# Patient Record
Sex: Female | Born: 1990 | Race: Black or African American | Hispanic: No | Marital: Single | Smoking: Never smoker
Health system: Southern US, Community
[De-identification: ages and names within clinical notes are randomized; demographics above are authoritative.]

## PROBLEM LIST (undated history)

## (undated) ENCOUNTER — Inpatient Hospital Stay (HOSPITAL_COMMUNITY): Payer: Medicaid Other

## (undated) ENCOUNTER — Inpatient Hospital Stay (HOSPITAL_COMMUNITY): Payer: Self-pay

## (undated) DIAGNOSIS — Z8619 Personal history of other infectious and parasitic diseases: Secondary | ICD-10-CM

## (undated) DIAGNOSIS — Z789 Other specified health status: Secondary | ICD-10-CM

## (undated) HISTORY — PX: NO PAST SURGERIES: SHX2092

## (undated) HISTORY — DX: Personal history of other infectious and parasitic diseases: Z86.19

---

## 2009-11-19 ENCOUNTER — Inpatient Hospital Stay (HOSPITAL_COMMUNITY): Admission: AD | Admit: 2009-11-19 | Discharge: 2009-11-19 | Payer: Self-pay | Admitting: Obstetrics & Gynecology

## 2010-06-18 NOTE — L&D Delivery Note (Signed)
Delivery Note At 1:26 AM a viable female was delivered via Vaginal, Spontaneous Delivery (Presentation: Middle Occiput Anterior).  APGAR: 9, 9; weight .   Placenta status: Intact, Spontaneous.  Cord: 3 vessels with the following complications: None.  Cord pH: not done  Anesthesia: Epidural  Episiotomy: None Lacerations: 1st degree;Sulcus Suture Repair: 2.0 Est. Blood Loss (mL):   Mom to postpartum.  Baby to nursery-stable.  Stacey Shepard A 05/14/2011, 1:39 AM

## 2010-09-04 LAB — CBC
HCT: 38.6 % (ref 36.0–46.0)
Hemoglobin: 13.2 g/dL (ref 12.0–15.0)
MCV: 98.8 fL (ref 78.0–100.0)
Platelets: 261 10*3/uL (ref 150–400)
WBC: 7.9 10*3/uL (ref 4.0–10.5)

## 2010-09-04 LAB — URINALYSIS, ROUTINE W REFLEX MICROSCOPIC
Glucose, UA: NEGATIVE mg/dL
Nitrite: NEGATIVE
Protein, ur: NEGATIVE mg/dL
Urobilinogen, UA: 4 mg/dL — ABNORMAL HIGH (ref 0.0–1.0)

## 2010-09-04 LAB — WET PREP, GENITAL: Trich, Wet Prep: NONE SEEN

## 2010-09-26 ENCOUNTER — Inpatient Hospital Stay (INDEPENDENT_AMBULATORY_CARE_PROVIDER_SITE_OTHER)
Admission: RE | Admit: 2010-09-26 | Discharge: 2010-09-26 | Disposition: A | Discharge status: Home / Self Care | Payer: Self-pay | Source: Ambulatory Visit | Attending: Family Medicine | Admitting: Family Medicine

## 2010-09-26 DIAGNOSIS — Z331 Pregnant state, incidental: Secondary | ICD-10-CM

## 2010-09-26 LAB — POCT URINALYSIS DIP (DEVICE)
Glucose, UA: 100 mg/dL — AB
Nitrite: NEGATIVE
Protein, ur: 100 mg/dL — AB
Urobilinogen, UA: 4 mg/dL — ABNORMAL HIGH (ref 0.0–1.0)

## 2010-09-26 LAB — POCT PREGNANCY, URINE: Preg Test, Ur: POSITIVE

## 2010-11-21 ENCOUNTER — Inpatient Hospital Stay (HOSPITAL_COMMUNITY): Payer: Medicaid Other

## 2010-11-21 ENCOUNTER — Inpatient Hospital Stay (HOSPITAL_COMMUNITY)
Admission: AD | Admit: 2010-11-21 | Discharge: 2010-11-21 | Disposition: A | Discharge status: Home / Self Care | Payer: Medicaid Other | Source: Ambulatory Visit | Attending: Obstetrics | Admitting: Obstetrics

## 2010-11-21 DIAGNOSIS — R109 Unspecified abdominal pain: Secondary | ICD-10-CM | POA: Insufficient documentation

## 2010-11-21 DIAGNOSIS — O210 Mild hyperemesis gravidarum: Secondary | ICD-10-CM | POA: Insufficient documentation

## 2010-11-21 LAB — URINALYSIS, ROUTINE W REFLEX MICROSCOPIC
Bilirubin Urine: NEGATIVE
Ketones, ur: NEGATIVE mg/dL
Nitrite: NEGATIVE
Protein, ur: NEGATIVE mg/dL

## 2010-11-21 LAB — CBC
Hemoglobin: 11.6 g/dL — ABNORMAL LOW (ref 12.0–15.0)
MCHC: 34.9 g/dL (ref 30.0–36.0)
Platelets: 265 10*3/uL (ref 150–400)
RBC: 3.57 MIL/uL — ABNORMAL LOW (ref 3.87–5.11)

## 2010-11-21 LAB — WET PREP, GENITAL: Trich, Wet Prep: NONE SEEN

## 2010-11-21 LAB — COMPREHENSIVE METABOLIC PANEL
ALT: 14 U/L (ref 0–35)
AST: 18 U/L (ref 0–37)
Albumin: 2.9 g/dL — ABNORMAL LOW (ref 3.5–5.2)
CO2: 22 mEq/L (ref 19–32)
Calcium: 8.8 mg/dL (ref 8.4–10.5)
Chloride: 96 mEq/L (ref 96–112)
Creatinine, Ser: <0.47 mg/dL (ref 0.4–1.2)
Sodium: 126 mEq/L — ABNORMAL LOW (ref 135–145)

## 2010-11-22 LAB — URINE CULTURE
Colony Count: 7000
Culture  Setup Time: 201206060206

## 2011-04-06 LAB — ANTIBODY SCREEN: Antibody Screen: NEGATIVE

## 2011-05-13 ENCOUNTER — Inpatient Hospital Stay (HOSPITAL_COMMUNITY): Payer: Medicaid Other | Admitting: Anesthesiology

## 2011-05-13 ENCOUNTER — Inpatient Hospital Stay (HOSPITAL_COMMUNITY)
Admission: RE | Admit: 2011-05-13 | Discharge: 2011-05-16 | DRG: 775 | Disposition: A | Discharge status: Home / Self Care | Payer: Medicaid Other | Source: Ambulatory Visit | Attending: Obstetrics | Admitting: Obstetrics

## 2011-05-13 ENCOUNTER — Encounter (HOSPITAL_COMMUNITY): Payer: Self-pay

## 2011-05-13 ENCOUNTER — Encounter (HOSPITAL_COMMUNITY): Payer: Self-pay | Admitting: Anesthesiology

## 2011-05-13 LAB — CBC
HCT: 36.3 % (ref 36.0–46.0)
Hemoglobin: 12.4 g/dL (ref 12.0–15.0)
RBC: 3.94 MIL/uL (ref 3.87–5.11)
WBC: 7 10*3/uL (ref 4.0–10.5)

## 2011-05-13 LAB — RPR: RPR Ser Ql: NONREACTIVE

## 2011-05-13 MED ORDER — PHENYLEPHRINE 40 MCG/ML (10ML) SYRINGE FOR IV PUSH (FOR BLOOD PRESSURE SUPPORT): 80.0000 ug | PREFILLED_SYRINGE | INTRAVENOUS | Status: DC | PRN

## 2011-05-13 MED ORDER — EPHEDRINE 5 MG/ML INJ: 10.0000 mg | INTRAVENOUS | Status: DC | PRN

## 2011-05-13 MED ORDER — ONDANSETRON HCL 4 MG/2ML IJ SOLN
4.0000 mg | Freq: Four times a day (QID) | INTRAMUSCULAR | Status: DC | PRN
  Administered 2011-05-13: 4 mg via INTRAVENOUS
  Filled 2011-05-13: qty 2

## 2011-05-13 MED ORDER — LACTATED RINGERS IV SOLN
500.0000 mL | INTRAVENOUS | Status: DC | PRN
Start: 2011-05-13 — End: 2011-05-14

## 2011-05-13 MED ORDER — OXYCODONE-ACETAMINOPHEN 5-325 MG PO TABS
2.0000 | ORAL_TABLET | ORAL | Status: DC | PRN
  Filled 2011-05-13: qty 1

## 2011-05-13 MED ORDER — LIDOCAINE HCL (PF) 1 % IJ SOLN
30.0000 mL | INTRAMUSCULAR | Status: DC | PRN
  Filled 2011-05-13: qty 30

## 2011-05-13 MED ORDER — FLEET ENEMA 7-19 GM/118ML RE ENEM: 1.0000 | ENEMA | RECTAL | Status: DC | PRN

## 2011-05-13 MED ORDER — OXYTOCIN BOLUS FROM INFUSION
500.0000 mL | Freq: Once | INTRAVENOUS | Status: DC
  Filled 2011-05-13: qty 500
  Filled 2011-05-13: qty 1000

## 2011-05-13 MED ORDER — CITRIC ACID-SODIUM CITRATE 334-500 MG/5ML PO SOLN: 30.0000 mL | ORAL | Status: DC | PRN

## 2011-05-13 MED ORDER — LACTATED RINGERS IV SOLN
INTRAVENOUS | Status: DC
  Administered 2011-05-13 (×4): via INTRAVENOUS

## 2011-05-13 MED ORDER — LACTATED RINGERS IV SOLN: 500.0000 mL | Freq: Once | INTRAVENOUS | Status: DC

## 2011-05-13 MED ORDER — OXYTOCIN 20 UNITS IN LACTATED RINGERS INFUSION - SIMPLE
1.0000 m[IU]/min | INTRAVENOUS | Status: DC
  Administered 2011-05-13: 1 m[IU]/min via INTRAVENOUS

## 2011-05-13 MED ORDER — IBUPROFEN 600 MG PO TABS
600.0000 mg | ORAL_TABLET | Freq: Four times a day (QID) | ORAL | Status: DC | PRN
  Filled 2011-05-13 (×2): qty 1

## 2011-05-13 MED ORDER — TERBUTALINE SULFATE 1 MG/ML IJ SOLN: 0.2500 mg | Freq: Once | INTRAMUSCULAR | Status: AC | PRN

## 2011-05-13 MED ORDER — OXYTOCIN 20 UNITS IN LACTATED RINGERS INFUSION - SIMPLE: 125.0000 mL/h | Freq: Once | INTRAVENOUS | Status: DC

## 2011-05-13 MED ORDER — EPHEDRINE 5 MG/ML INJ
10.0000 mg | INTRAVENOUS | Status: DC | PRN
  Administered 2011-05-13: 20 mg via INTRAVENOUS
  Filled 2011-05-13: qty 4

## 2011-05-13 MED ORDER — LIDOCAINE-EPINEPHRINE (PF) 2 %-1:200000 IJ SOLN
INTRAMUSCULAR | Status: DC | PRN
  Administered 2011-05-13: 4 mL via EPIDURAL

## 2011-05-13 MED ORDER — ACETAMINOPHEN 325 MG PO TABS: 650.0000 mg | ORAL_TABLET | ORAL | Status: DC | PRN

## 2011-05-13 MED ORDER — BUTORPHANOL TARTRATE 2 MG/ML IJ SOLN
1.0000 mg | INTRAMUSCULAR | Status: DC | PRN
  Administered 2011-05-13 (×2): 1 mg via INTRAVENOUS
  Filled 2011-05-13 (×2): qty 1

## 2011-05-13 MED ORDER — PHENYLEPHRINE 40 MCG/ML (10ML) SYRINGE FOR IV PUSH (FOR BLOOD PRESSURE SUPPORT)
80.0000 ug | PREFILLED_SYRINGE | INTRAVENOUS | Status: DC | PRN
  Filled 2011-05-13: qty 5

## 2011-05-13 MED ORDER — DIPHENHYDRAMINE HCL 50 MG/ML IJ SOLN: 12.5000 mg | INTRAMUSCULAR | Status: DC | PRN

## 2011-05-13 MED ORDER — FENTANYL 2.5 MCG/ML BUPIVACAINE 1/10 % EPIDURAL INFUSION (WH - ANES)
INTRAMUSCULAR | Status: DC | PRN
  Administered 2011-05-13: 14 mL/h via EPIDURAL

## 2011-05-13 MED ORDER — FENTANYL 2.5 MCG/ML BUPIVACAINE 1/10 % EPIDURAL INFUSION (WH - ANES)
14.0000 mL/h | INTRAMUSCULAR | Status: DC
  Filled 2011-05-13: qty 60

## 2011-05-13 NOTE — Progress Notes (Signed)
[place] intermittently tracing maternal heart rate--pt at bedside on birthing ball

## 2011-05-13 NOTE — Anesthesia Procedure Notes (Signed)

## 2011-05-13 NOTE — Anesthesia Preprocedure Evaluation (Signed)

## 2011-05-13 NOTE — H&P (Signed)
This is Dr. Francoise Ceo dictating the history and physical on  Stacey Shepard she's a 20 year old gravida 1 at 55 weeks and 3 days used to 05/03/2011 negative GBS was brought in for induction cervix no 2-3 cm 90% vertex -2 amniotomy was performed the fluid was clear and she still contracting irregularly on low-dose Pitocin Past medical history negative Past surgical history negative System review negative Social history negative Physical exam well-developed female in no distress HEENT negative Breasts negative Heart regular rhythm no murmurs no gallops Abdomen term Pelvic as described above Extremities negative

## 2011-05-14 ENCOUNTER — Encounter (HOSPITAL_COMMUNITY): Payer: Self-pay

## 2011-05-14 LAB — CBC
HCT: 31.6 % — ABNORMAL LOW (ref 36.0–46.0)
Hemoglobin: 10.5 g/dL — ABNORMAL LOW (ref 12.0–15.0)
MCH: 30.9 pg (ref 26.0–34.0)
MCHC: 33.2 g/dL (ref 30.0–36.0)
MCV: 92.9 fL (ref 78.0–100.0)
RBC: 3.4 MIL/uL — ABNORMAL LOW (ref 3.87–5.11)

## 2011-05-14 MED ORDER — LANOLIN HYDROUS EX OINT: TOPICAL_OINTMENT | CUTANEOUS | Status: DC | PRN

## 2011-05-14 MED ORDER — INFLUENZA VIRUS VACC SPLIT PF IM SUSP
0.5000 mL | INTRAMUSCULAR | Status: AC
  Filled 2011-05-14: qty 0.5

## 2011-05-14 MED ORDER — SIMETHICONE 80 MG PO CHEW: 80.0000 mg | CHEWABLE_TABLET | ORAL | Status: DC | PRN

## 2011-05-14 MED ORDER — FERROUS SULFATE 325 (65 FE) MG PO TABS
325.0000 mg | ORAL_TABLET | Freq: Two times a day (BID) | ORAL | Status: DC
  Administered 2011-05-14 – 2011-05-16 (×5): 325 mg via ORAL
  Filled 2011-05-14 (×5): qty 1

## 2011-05-14 MED ORDER — IBUPROFEN 600 MG PO TABS
600.0000 mg | ORAL_TABLET | Freq: Four times a day (QID) | ORAL | Status: DC
  Administered 2011-05-14 – 2011-05-16 (×9): 600 mg via ORAL
  Filled 2011-05-14 (×7): qty 1

## 2011-05-14 MED ORDER — ZOLPIDEM TARTRATE 5 MG PO TABS: 5.0000 mg | ORAL_TABLET | Freq: Every evening | ORAL | Status: DC | PRN

## 2011-05-14 MED ORDER — BENZOCAINE-MENTHOL 20-0.5 % EX AERO: 1.0000 "application " | INHALATION_SPRAY | CUTANEOUS | Status: DC | PRN

## 2011-05-14 MED ORDER — OXYCODONE-ACETAMINOPHEN 5-325 MG PO TABS
1.0000 | ORAL_TABLET | ORAL | Status: DC | PRN
  Administered 2011-05-14 – 2011-05-15 (×2): 1 via ORAL
  Administered 2011-05-15: 2 via ORAL
  Administered 2011-05-16: 1 via ORAL
  Filled 2011-05-14: qty 1
  Filled 2011-05-14: qty 2
  Filled 2011-05-14: qty 1

## 2011-05-14 MED ORDER — ONDANSETRON HCL 4 MG/2ML IJ SOLN
4.0000 mg | INTRAMUSCULAR | Status: DC | PRN
  Administered 2011-05-14: 4 mg via INTRAVENOUS
  Filled 2011-05-14: qty 2

## 2011-05-14 MED ORDER — DIBUCAINE 1 % RE OINT: 1.0000 "application " | TOPICAL_OINTMENT | RECTAL | Status: DC | PRN

## 2011-05-14 MED ORDER — DIPHENHYDRAMINE HCL 25 MG PO CAPS: 25.0000 mg | ORAL_CAPSULE | Freq: Four times a day (QID) | ORAL | Status: DC | PRN

## 2011-05-14 MED ORDER — TETANUS-DIPHTH-ACELL PERTUSSIS 5-2.5-18.5 LF-MCG/0.5 IM SUSP
0.5000 mL | Freq: Once | INTRAMUSCULAR | Status: AC
  Administered 2011-05-15: 0.5 mL via INTRAMUSCULAR
  Filled 2011-05-14: qty 0.5

## 2011-05-14 MED ORDER — WITCH HAZEL-GLYCERIN EX PADS: 1.0000 "application " | MEDICATED_PAD | CUTANEOUS | Status: DC | PRN

## 2011-05-14 MED ORDER — PRENATAL PLUS 27-1 MG PO TABS
1.0000 | ORAL_TABLET | Freq: Every day | ORAL | Status: DC
  Administered 2011-05-14 – 2011-05-16 (×3): 1 via ORAL
  Filled 2011-05-14 (×3): qty 1

## 2011-05-14 MED ORDER — ONDANSETRON HCL 4 MG PO TABS: 4.0000 mg | ORAL_TABLET | ORAL | Status: DC | PRN

## 2011-05-14 MED ORDER — SENNOSIDES-DOCUSATE SODIUM 8.6-50 MG PO TABS
2.0000 | ORAL_TABLET | Freq: Every day | ORAL | Status: DC
  Administered 2011-05-14 – 2011-05-15 (×2): 2 via ORAL

## 2011-05-14 NOTE — Progress Notes (Signed)
UR chart review completed.  

## 2011-05-14 NOTE — Progress Notes (Addendum)
Charted 5am assessment under wrong column, assessment done at 0500, not 0537

## 2011-05-15 MED ORDER — BENZOCAINE-MENTHOL 20-0.5 % EX AERO
INHALATION_SPRAY | CUTANEOUS | Status: AC
  Administered 2011-05-15: 22:00:00
  Filled 2011-05-15: qty 56

## 2011-05-15 NOTE — Progress Notes (Signed)
Patient ID: Stacey Shepard, female   DOB: 11/26/1990, 20 y.o.   MRN: [id number] Postpartum day #1 Fundus firm  Lochia moderate Legs negative No problems

## 2011-05-16 MED ORDER — OXYCODONE-ACETAMINOPHEN 5-325 MG PO TABS: 1.0000 | ORAL_TABLET | ORAL | Status: AC | PRN

## 2011-05-16 NOTE — Discharge Summary (Signed)
Obstetric Discharge Summary Reason for Admission: induction of labor Prenatal Procedures: none Intrapartum Procedures: spontaneous vaginal delivery Postpartum Procedures: none Complications-Operative and Postpartum: none Hemoglobin  Date Value Range Status  05/14/2011 10.5* 12.0-15.0 (g/dL) Final     HCT  Date Value Range Status  05/14/2011 31.6* 36.0-46.0 (%) Final    Discharge Diagnoses: Term Pregnancy-delivered  Discharge Information: Date: 05/16/2011 Activity: pelvic rest Diet: routine Medications: Percocet Condition: stable Instructions: refer to practice specific booklet Discharge to: home Follow-up Information    Follow up with Nolton Denis A, MD. Call in 6 weeks.   Contact information:   [address] [place] [address] [phone]          Newborn Data: Live born female  Birth Weight: 6 lb 12.1 oz (3065 g) APGAR: 9, 9  Home with mother.  Stacey Shepard 05/16/2011, 7:06 AM

## 2011-05-16 NOTE — Progress Notes (Signed)
Patient ID: Stacey Shepard, female   DOB: 06/20/1990, 20 y.o.   MRN: [id number] Postpartum day 2 Fundus firm Bile signs normal Lochia moderate Legs negative Discharge today

## 2011-05-19 ENCOUNTER — Inpatient Hospital Stay (HOSPITAL_COMMUNITY): Admit: 2011-05-19 | Payer: Self-pay | Source: Ambulatory Visit | Admitting: Obstetrics

## 2011-06-19 NOTE — L&D Delivery Note (Signed)
Delivery Note At 6:50 AM a viable female was delivered via  (Presentation: ;  ).  APGAR: , ; weight .   Placenta status: Intact, Spontaneous.  Cord: 3 vessels with the following complications: None.  Cord pH: not done  Anesthesia: Epidural  Episiotomy:  Lacerations:  Suture Repair: 2.0 Est. Blood Loss (mL):   Mom to postpartum.  Baby to nursery-stable.  MARSHALL,BERNARD A 05/26/2012, 6:57 AM

## 2011-11-29 ENCOUNTER — Encounter (HOSPITAL_COMMUNITY): Payer: Self-pay | Admitting: *Deleted

## 2011-11-29 ENCOUNTER — Inpatient Hospital Stay (HOSPITAL_COMMUNITY)
Admission: AD | Admit: 2011-11-29 | Discharge: 2011-11-29 | Disposition: A | Discharge status: Home / Self Care | Payer: Medicaid Other | Source: Ambulatory Visit | Attending: Obstetrics & Gynecology | Admitting: Obstetrics & Gynecology

## 2011-11-29 ENCOUNTER — Emergency Department (INDEPENDENT_AMBULATORY_CARE_PROVIDER_SITE_OTHER)
Admission: EM | Admit: 2011-11-29 | Discharge: 2011-11-29 | Disposition: A | Discharge status: Other Acute Inpatient Hospital | Payer: Self-pay | Source: Home / Self Care | Attending: Emergency Medicine | Admitting: Emergency Medicine

## 2011-11-29 DIAGNOSIS — A5901 Trichomonal vulvovaginitis: Secondary | ICD-10-CM | POA: Insufficient documentation

## 2011-11-29 DIAGNOSIS — R109 Unspecified abdominal pain: Secondary | ICD-10-CM

## 2011-11-29 DIAGNOSIS — N949 Unspecified condition associated with female genital organs and menstrual cycle: Secondary | ICD-10-CM

## 2011-11-29 DIAGNOSIS — Z331 Pregnant state, incidental: Secondary | ICD-10-CM

## 2011-11-29 DIAGNOSIS — O98819 Other maternal infectious and parasitic diseases complicating pregnancy, unspecified trimester: Secondary | ICD-10-CM | POA: Insufficient documentation

## 2011-11-29 DIAGNOSIS — Z3490 Encounter for supervision of normal pregnancy, unspecified, unspecified trimester: Secondary | ICD-10-CM

## 2011-11-29 DIAGNOSIS — R102 Pelvic and perineal pain: Secondary | ICD-10-CM

## 2011-11-29 LAB — POCT URINALYSIS DIP (DEVICE)
Bilirubin Urine: NEGATIVE
Glucose, UA: NEGATIVE mg/dL
Hgb urine dipstick: NEGATIVE
Ketones, ur: NEGATIVE mg/dL
Nitrite: NEGATIVE

## 2011-11-29 LAB — WET PREP, GENITAL

## 2011-11-29 MED ORDER — PRENATAL PLUS 27-1 MG PO TABS: 1.0000 | ORAL_TABLET | Freq: Every day | ORAL | Status: DC

## 2011-11-29 MED ORDER — METRONIDAZOLE 500 MG PO TABS: 500.0000 mg | ORAL_TABLET | Freq: Two times a day (BID) | ORAL | Status: AC

## 2011-11-29 MED ORDER — METRONIDAZOLE 500 MG PO TABS: 500.0000 mg | ORAL_TABLET | Freq: Once | ORAL | Status: DC

## 2011-11-29 NOTE — MAU Provider Note (Signed)
Stacey L Johnson20 y.o.G2P1001 @[redacted]w[redacted]d  by LMP 08/14/11 Chief Complaint  Patient presents with  . Pelvic Pain    Pt contact at 1630    SUBJECTIVE  HPI: She is sent from [facility] where she presented with lower abdominal pain today. Had positive UPT and negative UA. She describes mild right lower quadrant and bilateral groin pain worse with walking. No vaginal bleeding other than spotting in early March. She has a 56-month-old.   Past Medical History  Diagnosis Date  . No pertinent past medical history    Past Surgical History  Procedure Date  . No past surgeries    History   Social History  . Marital Status: Single    Spouse Name: N/A    Number of Children: N/A  . Years of Education: N/A   Occupational History  . Not on file.   Social History Main Topics  . Smoking status: Never Smoker   . Smokeless tobacco: Not on file  . Alcohol Use: No  . Drug Use: No  . Sexually Active: Yes   Other Topics Concern  . Not on file   Social History Narrative  . No narrative on file   No current facility-administered medications on file prior to encounter.   No current outpatient prescriptions on file prior to encounter.   No Known Allergies  ROS: Pertinent items in HPI  OBJECTIVE Blood pressure 100/44, pulse 95, temperature 98 F (36.7 C), temperature source Oral, resp. rate 18, last menstrual period 08/14/2011, not currently breastfeeding.  GENERAL: Well-developed, well-nourished female in no acute distress.  HEENT: Normocephalic, good dentition HEART: normal rate RESP: normal effort ABDOMEN: Soft, minimally tender right lower quadrant to midline, no rebound, no guarding, DT FHR 152 EXTREMITIES: Nontender, no edema NEURO: Alert and oriented SPECULUM EXAM: NEFG, frothy discharge, no blood noted, cervix clean BIMANUAL: cervix closed; uterus appropriate size for gestational age; no adnexal tenderness or masses   LAB RESULTS Results for orders placed during the  hospital encounter of 11/29/11 (from the past 24 hour(s))  WET PREP, GENITAL     Status: Abnormal   Collection Time   11/29/11  4:38 PM      Component Value Range   Yeast Wet Prep HPF POC NONE SEEN  NONE SEEN   Trich, Wet Prep FEW (*) NONE SEEN   Clue Cells Wet Prep HPF POC FEW (*) NONE SEEN   WBC, Wet Prep HPF POC MODERATE (*) NONE SEEN        ASSESSMENT [id number]@ [redacted]w[redacted]d RLP Trichimonas vaginalis Short interval b/t pregnancies  PLAN Rx Flagyl GC/CT sent RLP relief measures Pregnancy verification letter, prescription for prenatal vitamins, pregnancy precautions PNC with Dr. Gaynell Face asap     Kahlen Boyde 11/29/2011 4:43 PM

## 2011-11-29 NOTE — ED Notes (Signed)
Pt  Reports  Symptoms  Of  abd  Pain   With  Nausea  Vomiting  And  Diarrhea        She  Is  Also  Late  And  irreg  With  Periods       Sexually  Active  No  Birth  Control         She    Is  Ambulatory  With a  Steady   Fluid  Gait

## 2011-11-29 NOTE — ED Notes (Signed)
Pt  Will  Be  Discharged   And  [facility]  [facility]

## 2011-11-29 NOTE — Discharge Instructions (Signed)
Trichomoniasis  Trichomoniasis is an infection, caused by the Trichomonas organism, that affects both women and men. In women, the outer female genitalia and the vagina are affected. In men, the penis is mainly affected, but the prostate and other reproductive organs can also be involved. Trichomoniasis is a sexually transmitted disease (STD) and is most often passed to another person through sexual contact. The majority of people who get trichomoniasis do so from a sexual encounter and are also at risk for other STDs.  CAUSES    Sexual intercourse with an infected partner.   It can be present in swimming pools or hot tubs.  SYMPTOMS    Abnormal gray-green frothy vaginal discharge in women.   Vaginal itching and irritation in women.   Itching and irritation of the area outside the vagina in women.   Penile discharge with or without pain in males.   Inflammation of the urethra (urethritis), causing painful urination.   Bleeding after sexual intercourse.  RELATED COMPLICATIONS   Pelvic inflammatory disease.   Infection of the uterus (endometritis).   Infertility.   Tubal (ectopic) pregnancy.   It can be associated with other STDs, including gonorrhea and chlamydia, hepatitis B, and HIV.  COMPLICATIONS DURING PREGNANCY   Early (premature) delivery.   Premature rupture of the membranes (PROM).   Low birth weight.  DIAGNOSIS    Visualization of Trichomonas under the microscope from the vagina discharge.   Ph of the vagina greater than 4.5, tested with a test tape.   Trich Rapid Test.   Culture of the organism, but this is not usually needed.   It may be found on a Pap test.   Having a "strawberry cervix,"which means the cervix looks very red like a strawberry.  TREATMENT    You may be given medication to fight the infection. Inform your caregiver if you could be or are pregnant. Some medications used to treat the infection should not be taken during pregnancy.   Over-the-counter medications or  creams to decrease itching or irritation may be recommended.   Your sexual partner will need to be treated if infected.  HOME CARE INSTRUCTIONS    Take all medication prescribed by your caregiver.   Take over-the-counter medication for itching or irritation as directed by your caregiver.   Do not have sexual intercourse while you have the infection.   Do not douche or wear tampons.   Discuss your infection with your partner, as your partner may have acquired the infection from you. Or, your partner may have been the person who transmitted the infection to you.   Have your sex partner examined and treated if necessary.   Practice safe, informed, and protected sex.   See your caregiver for other STD testing.  SEEK MEDICAL CARE IF:    You still have symptoms after you finish the medication.   You have an oral temperature above 102 F (38.9 C).   You develop belly (abdominal) pain.   You have pain when you urinate.   You have bleeding after sexual intercourse.   You develop a rash.   The medication makes you sick or makes you throw up (vomit).  Document Released: 11/28/2000 Document Revised: 05/24/2011 Document Reviewed: 12/24/2008  ExitCare Patient Information 2012 [employer].

## 2011-11-29 NOTE — ED Provider Notes (Signed)
History     CSN: [id number]  Arrival date & time 11/29/11  1226   First MD Initiated Contact with Patient 11/29/11 1326      Chief Complaint  Patient presents with  . Abdominal Pain    (Consider location/radiation/quality/duration/timing/severity/associated sxs/prior treatment) HPI Comments: Patient presents urgent care complaining of lower abdominal pain (points to right lower quadrant and suprapubic area) it's been bothering her for about 5-6 days, worse since yesterday. She feels somewhat nauseous and he vomited and describes a episodes of loose stools. She describes her last menstrual period she thinks was in February with some irregular spotting noted at the end of March. Patient has a 32-month-old girl, and have not been using any type of birth control and is sexually active. She describes the pain as mainly elicited with walking and palpation.  She denies any dysuria, no fevers, and no active vaginal bleeding or discharge.  Patient is a 21 y.o. female presenting with abdominal pain. The history is provided by the patient.  Abdominal Pain The primary symptoms of the illness include abdominal pain, nausea and vomiting. The primary symptoms of the illness do not include fever, fatigue, dysuria, vaginal discharge or vaginal bleeding. The current episode started more than 2 days ago. The onset of the illness was sudden.  Symptoms associated with the illness do not include chills, constipation, urgency or frequency.    Past Medical History  Diagnosis Date  . No pertinent past medical history     Past Surgical History  Procedure Date  . No past surgeries     No family history on file.  History  Substance Use Topics  . Smoking status: Never Smoker   . Smokeless tobacco: Not on file  . Alcohol Use: No    OB History    Grav Para Term Preterm Abortions TAB SAB Ect Mult Living   1 1 1  0 0 0 0 0 0 1      Review of Systems  Constitutional: Negative for fever, chills,  activity change and fatigue.  Gastrointestinal: Positive for nausea, vomiting and abdominal pain. Negative for constipation and abdominal distention.  Genitourinary: Positive for pelvic pain. Negative for dysuria, urgency, frequency, vaginal bleeding, vaginal discharge and vaginal pain.    Allergies  Review of patient's allergies indicates no known allergies.  Home Medications  No current outpatient prescriptions on file.  BP 94/56  Pulse 82  Temp 97.6 F (36.4 C) (Oral)  Resp 14  SpO2 97%  Physical Exam  Nursing note and vitals reviewed. Constitutional: Vital signs are normal. She appears well-developed and well-nourished.  Non-toxic appearance. She does not have a sickly appearance. She does not appear ill. No distress.  Abdominal: Soft. She exhibits no distension and no mass. There is tenderness. There is no rebound and no guarding.  Genitourinary:     Neurological: She is alert.  Skin: Skin is warm.    ED Course  Procedures (including critical care time)  Labs Reviewed  POCT URINALYSIS DIP (DEVICE) - Abnormal; Notable for the following:    Leukocytes, UA SMALL (*)  Biochemical Testing Only. Please order routine urinalysis from main lab if confirmatory testing is needed.   All other components within normal limits  POCT PREGNANCY, URINE - Abnormal; Notable for the following:    Preg Test, Ur POSITIVE (*)     All other components within normal limits   No results found.   1. Pregnancy   2. Pelvic pain  MDM  Pelvic pain, second trimester pregnancy. Patient was transferred to [facility] hospital by her own transportation to rule out an ectopic pregnancy. Patient is hemodynamically stable looks comfortable was unaware that she is pregnant. Have called MAU unit and discuss case with nurse practitioner Christy Gentles)        Jimmie Molly, MD 11/29/11 [phone]

## 2011-11-29 NOTE — MAU Note (Signed)
Pt states she started having pain a few days ago

## 2011-11-29 NOTE — Discharge Instructions (Signed)
[facility] inform [facility] that you are on your way as you need further evaluation in relationship with your pelvic pain and your pregnancy.

## 2011-11-29 NOTE — ED Notes (Signed)
Pt  denys  Any  Vaginal  Bleeding  /  Discharge  Or     Urinary  Symptoms

## 2011-11-29 NOTE — Progress Notes (Signed)
Heart tones heard mid pelvic area

## 2011-11-30 LAB — GC/CHLAMYDIA PROBE AMP, GENITAL
Chlamydia, DNA Probe: NEGATIVE
GC Probe Amp, Genital: NEGATIVE

## 2012-01-25 LAB — OB RESULTS CONSOLE HIV ANTIBODY (ROUTINE TESTING): HIV: NONREACTIVE

## 2012-01-25 LAB — OB RESULTS CONSOLE ABO/RH: RH Type: POSITIVE

## 2012-01-25 LAB — OB RESULTS CONSOLE RPR: RPR: NONREACTIVE

## 2012-05-22 ENCOUNTER — Encounter (HOSPITAL_COMMUNITY): Payer: Self-pay | Admitting: *Deleted

## 2012-05-22 ENCOUNTER — Telehealth (HOSPITAL_COMMUNITY): Payer: Self-pay | Admitting: *Deleted

## 2012-05-22 NOTE — Telephone Encounter (Signed)
Preadmission screen  

## 2012-05-25 ENCOUNTER — Encounter (HOSPITAL_COMMUNITY): Payer: Self-pay

## 2012-05-25 ENCOUNTER — Inpatient Hospital Stay (HOSPITAL_COMMUNITY)
Admission: RE | Admit: 2012-05-25 | Discharge: 2012-05-28 | DRG: 775 | Disposition: A | Discharge status: Home / Self Care | Payer: Medicaid Other | Source: Ambulatory Visit | Attending: Obstetrics | Admitting: Obstetrics

## 2012-05-25 LAB — CBC
HCT: 33 % — ABNORMAL LOW (ref 36.0–46.0)
MCV: 90.2 fL (ref 78.0–100.0)
RBC: 3.66 MIL/uL — ABNORMAL LOW (ref 3.87–5.11)
RDW: 13.8 % (ref 11.5–15.5)
WBC: 5.4 10*3/uL (ref 4.0–10.5)

## 2012-05-25 MED ORDER — FLEET ENEMA 7-19 GM/118ML RE ENEM: 1.0000 | ENEMA | RECTAL | Status: DC | PRN

## 2012-05-25 MED ORDER — MISOPROSTOL 25 MCG QUARTER TABLET
25.0000 ug | ORAL_TABLET | ORAL | Status: DC | PRN
  Administered 2012-05-25 – 2012-05-26 (×2): 25 ug via VAGINAL
  Filled 2012-05-25 (×2): qty 0.25

## 2012-05-25 MED ORDER — IBUPROFEN 600 MG PO TABS
600.0000 mg | ORAL_TABLET | Freq: Four times a day (QID) | ORAL | Status: DC | PRN
  Filled 2012-05-25 (×4): qty 1

## 2012-05-25 MED ORDER — BUTORPHANOL TARTRATE 1 MG/ML IJ SOLN
1.0000 mg | INTRAMUSCULAR | Status: DC | PRN
  Administered 2012-05-26: 1 mg via INTRAVENOUS
  Filled 2012-05-25: qty 1

## 2012-05-25 MED ORDER — LACTATED RINGERS IV SOLN: 500.0000 mL | INTRAVENOUS | Status: DC | PRN

## 2012-05-25 MED ORDER — ONDANSETRON HCL 4 MG/2ML IJ SOLN: 4.0000 mg | Freq: Four times a day (QID) | INTRAMUSCULAR | Status: DC | PRN

## 2012-05-25 MED ORDER — LACTATED RINGERS IV SOLN
INTRAVENOUS | Status: DC
  Administered 2012-05-25 – 2012-05-26 (×3): via INTRAVENOUS

## 2012-05-25 MED ORDER — TERBUTALINE SULFATE 1 MG/ML IJ SOLN: 0.2500 mg | Freq: Once | INTRAMUSCULAR | Status: AC | PRN

## 2012-05-25 MED ORDER — OXYTOCIN BOLUS FROM INFUSION
500.0000 mL | INTRAVENOUS | Status: DC
  Administered 2012-05-26: 500 mL via INTRAVENOUS

## 2012-05-25 MED ORDER — LIDOCAINE HCL (PF) 1 % IJ SOLN
30.0000 mL | INTRAMUSCULAR | Status: DC | PRN
  Filled 2012-05-25: qty 30

## 2012-05-25 MED ORDER — OXYCODONE-ACETAMINOPHEN 5-325 MG PO TABS
1.0000 | ORAL_TABLET | ORAL | Status: DC | PRN
  Administered 2012-05-26 – 2012-05-28 (×4): 1 via ORAL
  Filled 2012-05-25 (×2): qty 1

## 2012-05-25 MED ORDER — ACETAMINOPHEN 325 MG PO TABS: 650.0000 mg | ORAL_TABLET | ORAL | Status: DC | PRN

## 2012-05-25 MED ORDER — CITRIC ACID-SODIUM CITRATE 334-500 MG/5ML PO SOLN: 30.0000 mL | ORAL | Status: DC | PRN

## 2012-05-25 MED ORDER — OXYTOCIN 40 UNITS IN LACTATED RINGERS INFUSION - SIMPLE MED
62.5000 mL/h | INTRAVENOUS | Status: DC
  Filled 2012-05-25: qty 1000

## 2012-05-25 NOTE — Progress Notes (Signed)
Dr. Tamela Oddi called informed of Dr. Elsie Stain IOL for elective reason, updated pt is a G2P1 at 39.6 with neg GBS, given pt status and SVE, orders received for Cytotec and Stadol PRN

## 2012-05-26 ENCOUNTER — Encounter (HOSPITAL_COMMUNITY): Payer: Self-pay | Admitting: Anesthesiology

## 2012-05-26 ENCOUNTER — Encounter (HOSPITAL_COMMUNITY): Payer: Self-pay

## 2012-05-26 ENCOUNTER — Inpatient Hospital Stay (HOSPITAL_COMMUNITY): Payer: Medicaid Other | Admitting: Anesthesiology

## 2012-05-26 MED ORDER — WITCH HAZEL-GLYCERIN EX PADS: 1.0000 "application " | MEDICATED_PAD | CUTANEOUS | Status: DC | PRN

## 2012-05-26 MED ORDER — FENTANYL 2.5 MCG/ML BUPIVACAINE 1/10 % EPIDURAL INFUSION (WH - ANES)
14.0000 mL/h | INTRAMUSCULAR | Status: DC
  Filled 2012-05-26: qty 125

## 2012-05-26 MED ORDER — TETANUS-DIPHTH-ACELL PERTUSSIS 5-2.5-18.5 LF-MCG/0.5 IM SUSP
0.5000 mL | Freq: Once | INTRAMUSCULAR | Status: AC
  Administered 2012-05-27: 0.5 mL via INTRAMUSCULAR
  Filled 2012-05-26: qty 0.5

## 2012-05-26 MED ORDER — FENTANYL 2.5 MCG/ML BUPIVACAINE 1/10 % EPIDURAL INFUSION (WH - ANES)
INTRAMUSCULAR | Status: DC | PRN
  Administered 2012-05-26: 14 mL/h via EPIDURAL

## 2012-05-26 MED ORDER — BENZOCAINE-MENTHOL 20-0.5 % EX AERO
1.0000 "application " | INHALATION_SPRAY | CUTANEOUS | Status: DC | PRN
  Administered 2012-05-26: 1 via TOPICAL
  Filled 2012-05-26: qty 56

## 2012-05-26 MED ORDER — EPHEDRINE 5 MG/ML INJ
10.0000 mg | INTRAVENOUS | Status: DC | PRN
  Filled 2012-05-26: qty 4

## 2012-05-26 MED ORDER — LACTATED RINGERS IV SOLN
500.0000 mL | Freq: Once | INTRAVENOUS | Status: AC
  Administered 2012-05-26: 500 mL via INTRAVENOUS

## 2012-05-26 MED ORDER — FERROUS SULFATE 325 (65 FE) MG PO TABS
325.0000 mg | ORAL_TABLET | Freq: Two times a day (BID) | ORAL | Status: DC
  Administered 2012-05-26 – 2012-05-28 (×4): 325 mg via ORAL
  Filled 2012-05-26 (×4): qty 1

## 2012-05-26 MED ORDER — ONDANSETRON HCL 4 MG PO TABS: 4.0000 mg | ORAL_TABLET | ORAL | Status: DC | PRN

## 2012-05-26 MED ORDER — DIPHENHYDRAMINE HCL 25 MG PO CAPS: 25.0000 mg | ORAL_CAPSULE | Freq: Four times a day (QID) | ORAL | Status: DC | PRN

## 2012-05-26 MED ORDER — LIDOCAINE HCL (PF) 1 % IJ SOLN
INTRAMUSCULAR | Status: DC | PRN
  Administered 2012-05-26 (×2): 4 mL

## 2012-05-26 MED ORDER — SIMETHICONE 80 MG PO CHEW: 80.0000 mg | CHEWABLE_TABLET | ORAL | Status: DC | PRN

## 2012-05-26 MED ORDER — INFLUENZA VIRUS VACC SPLIT PF IM SUSP
0.5000 mL | INTRAMUSCULAR | Status: AC
  Administered 2012-05-27: 0.5 mL via INTRAMUSCULAR
  Filled 2012-05-26: qty 0.5

## 2012-05-26 MED ORDER — SENNOSIDES-DOCUSATE SODIUM 8.6-50 MG PO TABS
2.0000 | ORAL_TABLET | Freq: Every day | ORAL | Status: DC
  Administered 2012-05-26 – 2012-05-27 (×2): 2 via ORAL

## 2012-05-26 MED ORDER — DIPHENHYDRAMINE HCL 50 MG/ML IJ SOLN: 12.5000 mg | INTRAMUSCULAR | Status: DC | PRN

## 2012-05-26 MED ORDER — ONDANSETRON HCL 4 MG/2ML IJ SOLN: 4.0000 mg | INTRAMUSCULAR | Status: DC | PRN

## 2012-05-26 MED ORDER — PHENYLEPHRINE 40 MCG/ML (10ML) SYRINGE FOR IV PUSH (FOR BLOOD PRESSURE SUPPORT): 80.0000 ug | PREFILLED_SYRINGE | INTRAVENOUS | Status: DC | PRN

## 2012-05-26 MED ORDER — ZOLPIDEM TARTRATE 5 MG PO TABS: 5.0000 mg | ORAL_TABLET | Freq: Every evening | ORAL | Status: DC | PRN

## 2012-05-26 MED ORDER — PHENYLEPHRINE 40 MCG/ML (10ML) SYRINGE FOR IV PUSH (FOR BLOOD PRESSURE SUPPORT)
80.0000 ug | PREFILLED_SYRINGE | INTRAVENOUS | Status: DC | PRN
  Filled 2012-05-26: qty 5

## 2012-05-26 MED ORDER — EPHEDRINE 5 MG/ML INJ: 10.0000 mg | INTRAVENOUS | Status: DC | PRN

## 2012-05-26 MED ORDER — IBUPROFEN 600 MG PO TABS
600.0000 mg | ORAL_TABLET | Freq: Four times a day (QID) | ORAL | Status: DC
  Administered 2012-05-26 – 2012-05-28 (×7): 600 mg via ORAL
  Filled 2012-05-26 (×3): qty 1

## 2012-05-26 MED ORDER — OXYCODONE-ACETAMINOPHEN 5-325 MG PO TABS
1.0000 | ORAL_TABLET | ORAL | Status: DC | PRN
  Administered 2012-05-26: 1 via ORAL
  Filled 2012-05-26 (×3): qty 1

## 2012-05-26 MED ORDER — DIBUCAINE 1 % RE OINT: 1.0000 "application " | TOPICAL_OINTMENT | RECTAL | Status: DC | PRN

## 2012-05-26 MED ORDER — LANOLIN HYDROUS EX OINT: TOPICAL_OINTMENT | CUTANEOUS | Status: DC | PRN

## 2012-05-26 MED ORDER — PRENATAL MULTIVITAMIN CH
1.0000 | ORAL_TABLET | Freq: Every day | ORAL | Status: DC
  Administered 2012-05-26 – 2012-05-28 (×3): 1 via ORAL
  Filled 2012-05-26 (×3): qty 1

## 2012-05-26 NOTE — Anesthesia Procedure Notes (Signed)
Epidural Patient location during procedure: OB Start time: 05/26/2012 6:03 AM  Staffing Anesthesiologist: Malen Gauze, Ranette Luckadoo A. Performed by: anesthesiologist   Preanesthetic Checklist Completed: patient identified, site marked, surgical consent, pre-op evaluation, timeout performed, IV checked, risks and benefits discussed and monitors and equipment checked  Epidural Patient position: sitting Prep: site prepped and draped and DuraPrep Patient monitoring: continuous pulse ox and blood pressure Approach: midline Injection technique: LOR air  Needle:  Needle type: Tuohy  Needle gauge: 17 G Needle length: 9 cm and 9 Needle insertion depth: 4 cm Catheter type: closed end flexible Catheter size: 19 Gauge Catheter at skin depth: 9 cm Test dose: negative and Other  Assessment Events: blood not aspirated, injection not painful, no injection resistance, negative IV test and no paresthesia  Additional Notes Patient identified. Risks and benefits discussed including failed block, incomplete  Pain control, post dural puncture headache, nerve damage, paralysis, blood pressure Changes, nausea, vomiting, reactions to medications-both toxic and allergic and post Partum back pain. All questions were answered. Patient expressed understanding and wished to proceed. Sterile technique was used throughout procedure. Epidural site was Dressed with sterile barrier dressing. No paresthesias, signs of intravascular injection Or signs of intrathecal spread were encountered.  Patient was more comfortable after the epidural was dosed. Please see RN's note for documentation of vital signs and FHR which are stable.

## 2012-05-26 NOTE — H&P (Signed)
This is Dr. Francoise Ceo dictating the history and physical on Stacey Shepard she's a 21 year old gravida 2 para 101 at 40 weeks EDC 05/26/2012 and she desired induction was brought in last night at 3 cm progressed satisfactorily in labor and she is now fully dilated membranes ruptured artificially fluid clear she is a plus one station she has a negative GBS Past medical history negative Past surgical history negative Social history negative System review negative Physical exam revealed a well-developed female in labor HEENT negative Breasts negative Lungs clear to P&A Heart regular rhythm no murmurs no gallops Abdomen term Pelvic as described above Extremities negative

## 2012-05-26 NOTE — Anesthesia Preprocedure Evaluation (Signed)

## 2012-05-27 LAB — CBC
HCT: 31 % — ABNORMAL LOW (ref 36.0–46.0)
MCHC: 33.2 g/dL (ref 30.0–36.0)
MCV: 89.9 fL (ref 78.0–100.0)
Platelets: 221 10*3/uL (ref 150–400)
RDW: 13.9 % (ref 11.5–15.5)

## 2012-05-27 NOTE — Progress Notes (Signed)
Patient ID: Stacey Shepard, female   DOB: [birth year], 21 y.o.   MRN: [id number] Postpartum day one Vital signs normal Fundus firm Lochia moderate No complaints doing well

## 2012-05-27 NOTE — Anesthesia Postprocedure Evaluation (Signed)
  Anesthesia Post-op Note  Patient: Stacey Shepard  Procedure(s) Performed: * No procedures listed *  Patient Location: Mother/Baby  Anesthesia Type:Epidural  Level of Consciousness: awake  Airway and Oxygen Therapy: Patient Spontanous Breathing  Post-op Pain: none  Post-op Assessment: Patient's Cardiovascular Status Stable, Respiratory Function Stable, Patent Airway, No signs of Nausea or vomiting, Adequate PO intake, Pain level controlled, No headache, No backache, No residual numbness and No residual motor weakness  Post-op Vital Signs: Reviewed and stable  Complications: No apparent anesthesia complications

## 2012-05-27 NOTE — Progress Notes (Signed)
UR chart review completed.  

## 2012-05-28 NOTE — Discharge Summary (Signed)
Obstetric Discharge Summary Reason for Admission: onset of labor Prenatal Procedures: none Intrapartum Procedures: spontaneous vaginal delivery Postpartum Procedures: none Complications-Operative and Postpartum: none Hemoglobin  Date Value Range Status  05/27/2012 10.3* 12.0 - 15.0 g/dL Final     HCT  Date Value Range Status  05/27/2012 31.0* 36.0 - 46.0 % Final    Physical Exam:  General: alert Lochia: appropriate Uterine Fundus: firm Incision: healing well DVT Evaluation: No evidence of DVT seen on physical exam.  Discharge Diagnoses: Term Pregnancy-delivered  Discharge Information: Date: 05/28/2012 Activity: pelvic rest Diet: routine Medications: Percocet Condition: stable Instructions: refer to practice specific booklet Discharge to: home Follow-up Information    Schedule an appointment as soon as possible for a visit in 6 weeks to follow up.         Newborn Data: Live born female  Birth Weight: 7 lb 4.8 oz (3310 g) APGAR: 8, 9  Home with mother.  MARSHALL,BERNARD A 05/28/2012, 8:12 AM

## 2012-12-02 ENCOUNTER — Emergency Department (HOSPITAL_COMMUNITY)
Admission: EM | Admit: 2012-12-02 | Discharge: 2012-12-02 | Disposition: A | Discharge status: Home / Self Care | Payer: Medicaid Other | Source: Home / Self Care

## 2012-12-02 ENCOUNTER — Encounter (HOSPITAL_COMMUNITY): Payer: Self-pay | Admitting: Emergency Medicine

## 2012-12-02 DIAGNOSIS — M778 Other enthesopathies, not elsewhere classified: Secondary | ICD-10-CM

## 2012-12-02 DIAGNOSIS — M65839 Other synovitis and tenosynovitis, unspecified forearm: Secondary | ICD-10-CM

## 2012-12-02 MED ORDER — DICLOFENAC POTASSIUM 50 MG PO TABS: 50.0000 mg | ORAL_TABLET | Freq: Three times a day (TID) | ORAL | Status: DC

## 2012-12-02 NOTE — ED Provider Notes (Signed)
History     CSN: [id number]  Arrival date & time 12/02/12  1347   First MD Initiated Contact with Patient 12/02/12 1405      No chief complaint on file.   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 She is complaining of pain in the extensor surface of the left thumb to the wrist. She denies any known injury.   Past Medical History  Diagnosis Date  . Hx of chlamydia infection     Past Surgical History  Procedure Laterality Date  . No past surgeries      No family history on file.  History  Substance Use Topics  . Smoking status: Never Smoker   . Smokeless tobacco: Never Used  . Alcohol Use: No    OB History   Grav Para Term Preterm Abortions TAB SAB Ect Mult Living   2 2 2  0 0 0 0 0 0 2      Review of Systems  Constitutional: Negative for fever, chills and activity change.  HENT: Negative.   Respiratory: Negative.   Cardiovascular: Negative.   Musculoskeletal:       As per HPI  Skin: Negative for color change, pallor and rash.  Neurological: Negative.     Allergies  Review of patient's allergies indicates no known allergies.  Home Medications   Current Outpatient Rx  Name  Route  Sig  Dispense  Refill  . diclofenac (CATAFLAM) 50 MG tablet   Oral   Take 1 tablet (50 mg total) by mouth 3 (three) times daily. As needed for pain. Take with food   24 tablet   0     BP 124/69  Pulse 80  Temp(Src) 97.9 F (36.6 C) (Oral)  Resp 16  SpO2 99%  LMP 11/27/2012  Breastfeeding? No  Physical Exam  Constitutional: She is oriented to person, place, and time. She appears well-developed and well-nourished. No distress.  HENT:  Head: Normocephalic and atraumatic.  Eyes: EOM are normal. Pupils are equal, round, and reactive to light.  Neck: Normal range of motion. Neck supple.  Musculoskeletal:  Tenderness along the extensor surface  of the thumb and radial aspect of the wrist. Positive Finkelstein sign. She is able to flex and extend the digits normally. Flexion extension of the wrist is complete however produces discomfort. Distal neurovascular and motor sensory is grossly intact. No deformity, swelling or discoloration.  Lymphadenopathy:    She has no cervical adenopathy.  Neurological: She is alert and oriented to person, place, and time. No cranial nerve deficit.  Skin: Skin is warm and dry.    ED Course  Procedures (including critical care time)  Labs Reviewed - No data to display No results found.   1. Tendinitis of thumb       MDM  Wear the splint for at least one week and always at work for 1 to 2 weeks.  Ice on and off during the day Cataflam 50 tid prn pain.         Hayden Rasmussen, NP 12/02/12 1432  Hayden Rasmussen, NP 12/02/12 [phone]

## 2012-12-02 NOTE — ED Provider Notes (Signed)
Medical screening examination/treatment/procedure(s) were performed by resident physician or non-physician practitioner and as supervising physician I was immediately available for consultation/collaboration.   KINDL,JAMES DOUGLAS MD.   James D Kindl, MD 12/02/12 1645 

## 2012-12-02 NOTE — ED Notes (Addendum)
Patient is having left wrist pain, below thumb on radial aspect of wrist.  Pain with movement of thumb and with rotating forearm.  No known injury

## 2014-04-19 ENCOUNTER — Encounter (HOSPITAL_COMMUNITY): Payer: Self-pay | Admitting: Emergency Medicine

## 2015-06-19 NOTE — L&D Delivery Note (Signed)
Delivery Note At 8:42 AM a viable female was delivered via Vaginal, Spontaneous Delivery (Presentation: ; Occiput Anterior).  APGAR: 8, 9; weight 7 lb 3.9 oz (3286 g) by Alfonso PattenN Wouk MD. Placenta status: Intact, Spontaneous.  Cord: 3 vessels   Anesthesia: None  Episiotomy: None Lacerations: 1st degree; Perineal Suture Repair: 3.0 vicryl, 2 interrupteds Est. Blood Loss (mL): 300  Repair by Loni MuseKate Timberlake MD, supervised by Pincus BadderK Katrinna Travieso  Mom to postpartum.  Baby to Couplet care / Skin to Skin.  Cam HaiSHAW, Kasen Sako CNM 12/28/2015, 12:41 PM

## 2015-06-21 LAB — OB RESULTS CONSOLE ABO/RH: RH Type: POSITIVE

## 2015-06-21 LAB — OB RESULTS CONSOLE GC/CHLAMYDIA
CHLAMYDIA, DNA PROBE: NEGATIVE
GC PROBE AMP, GENITAL: NEGATIVE

## 2015-06-21 LAB — OB RESULTS CONSOLE HIV ANTIBODY (ROUTINE TESTING): HIV: NONREACTIVE

## 2015-06-21 LAB — OB RESULTS CONSOLE HGB/HCT, BLOOD
HCT: 39 %
HEMOGLOBIN: 13.6 g/dL

## 2015-06-21 LAB — OB RESULTS CONSOLE ANTIBODY SCREEN: ANTIBODY SCREEN: NEGATIVE

## 2015-06-21 LAB — OB RESULTS CONSOLE RUBELLA ANTIBODY, IGM: RUBELLA: IMMUNE

## 2015-06-21 LAB — OB RESULTS CONSOLE HEPATITIS B SURFACE ANTIGEN: Hepatitis B Surface Ag: NEGATIVE

## 2015-06-21 LAB — OB RESULTS CONSOLE RPR: RPR: NONREACTIVE

## 2015-06-21 LAB — OB RESULTS CONSOLE PLATELET COUNT: PLATELETS: 339 10*3/uL

## 2015-09-20 LAB — OB RESULTS CONSOLE RPR: RPR: NONREACTIVE

## 2015-12-06 LAB — OB RESULTS CONSOLE GBS: GBS: NEGATIVE

## 2015-12-06 LAB — OB RESULTS CONSOLE GC/CHLAMYDIA
CHLAMYDIA, DNA PROBE: NEGATIVE
Gonorrhea: NEGATIVE

## 2015-12-22 ENCOUNTER — Encounter: Payer: Self-pay | Admitting: Certified Nurse Midwife

## 2015-12-22 ENCOUNTER — Ambulatory Visit (INDEPENDENT_AMBULATORY_CARE_PROVIDER_SITE_OTHER): Payer: Medicaid Other | Admitting: Certified Nurse Midwife

## 2015-12-22 VITALS — BP 109/64 | HR 86 | Temp 98.4°F | Wt 151.6 lb

## 2015-12-22 DIAGNOSIS — Z331 Pregnant state, incidental: Secondary | ICD-10-CM | POA: Diagnosis not present

## 2015-12-22 DIAGNOSIS — Z1389 Encounter for screening for other disorder: Secondary | ICD-10-CM

## 2015-12-22 DIAGNOSIS — Z3483 Encounter for supervision of other normal pregnancy, third trimester: Secondary | ICD-10-CM | POA: Diagnosis not present

## 2015-12-22 NOTE — Progress Notes (Signed)
I agree with note by NP Student Andrew Brake.  Was present for exam.  R.Caison Hearn CNM 

## 2015-12-22 NOTE — Progress Notes (Signed)
Subjective:    Stacey Shepard is a 25 y.o. female being seen today for her obstetrical visit. She is at 3369w1d gestation. Patient reports contractions since last week. Fetal movement: normal.  Works @ [employer], 4 days per week.  First 2 children required IOL.   Patient reports GBS performed by Dr. Trudie BucklerMarshal (results = negative).  Patient reports being 1 cm dilated.  Problem List Items Addressed This Visit    None    Visit Diagnoses    Encounter for supervision of other normal pregnancy in third trimester    -  Primary    Relevant Orders    POCT urinalysis dipstick      There are no active problems to display for this patient.   Objective:    BP 109/64 mmHg  Pulse 86  Temp(Src) 98.4 F (36.9 C)  Wt 151 lb 9.6 oz (68.765 kg)  LMP 04/06/2015 FHT: 135 BPM  Uterine Size: size equals dates  Presentations: cephalic  Pelvic Exam:              Dilation: 1cm       Effacement: 50%             Station:  -3    Consistency: medium            Position: middle     Assessment:    Pregnancy @ 4169w1d weeks   Plan:   Plans for delivery: Vaginal anticipated; labs reviewed; problem list updated Counseling: Consent signed. Infant feeding: plans to breastfeed. Cigarette smoking: never smoked. L&D discussion: symptoms of labor, discussed when to call, discussed what number to call, anesthetic/analgesic options reviewed. Postpartum supports and preparation: circumcision discussed and contraception plans discussed.  Follow up in 1 Week.

## 2015-12-25 ENCOUNTER — Encounter: Payer: Self-pay | Admitting: *Deleted

## 2015-12-27 ENCOUNTER — Inpatient Hospital Stay (HOSPITAL_COMMUNITY)
Admission: AD | Admit: 2015-12-27 | Discharge: 2015-12-30 | DRG: 775 | Disposition: A | Discharge status: Home / Self Care | Payer: Medicaid Other | Source: Ambulatory Visit | Attending: Obstetrics & Gynecology | Admitting: Obstetrics & Gynecology

## 2015-12-27 ENCOUNTER — Encounter (HOSPITAL_COMMUNITY): Payer: Self-pay

## 2015-12-27 DIAGNOSIS — Z3A37 37 weeks gestation of pregnancy: Secondary | ICD-10-CM | POA: Diagnosis not present

## 2015-12-27 DIAGNOSIS — O4292 Full-term premature rupture of membranes, unspecified as to length of time between rupture and onset of labor: Secondary | ICD-10-CM | POA: Diagnosis present

## 2015-12-27 DIAGNOSIS — O4202 Full-term premature rupture of membranes, onset of labor within 24 hours of rupture: Principal | ICD-10-CM | POA: Diagnosis present

## 2015-12-27 LAB — CBC
HCT: 33 % — ABNORMAL LOW (ref 36.0–46.0)
Hemoglobin: 11.2 g/dL — ABNORMAL LOW (ref 12.0–15.0)
MCH: 29.9 pg (ref 26.0–34.0)
MCHC: 33.9 g/dL (ref 30.0–36.0)
MCV: 88.2 fL (ref 78.0–100.0)
Platelets: 246 10*3/uL (ref 150–400)
RBC: 3.74 MIL/uL — ABNORMAL LOW (ref 3.87–5.11)
RDW: 13.4 % (ref 11.5–15.5)
WBC: 7.9 10*3/uL (ref 4.0–10.5)

## 2015-12-27 LAB — TYPE AND SCREEN
ABO/RH(D): A POS
Antibody Screen: NEGATIVE

## 2015-12-27 LAB — ABO/RH: ABO/RH(D): A POS

## 2015-12-27 MED ORDER — OXYTOCIN 40 UNITS IN LACTATED RINGERS INFUSION - SIMPLE MED
2.5000 [IU]/h | INTRAVENOUS | Status: DC
  Filled 2015-12-27: qty 1000

## 2015-12-27 MED ORDER — LACTATED RINGERS IV SOLN
500.0000 mL | INTRAVENOUS | Status: DC | PRN
Start: 2015-12-27 — End: 2015-12-29

## 2015-12-27 MED ORDER — OXYTOCIN BOLUS FROM INFUSION
500.0000 mL | INTRAVENOUS | Status: DC
  Administered 2015-12-28: 500 mL via INTRAVENOUS

## 2015-12-27 MED ORDER — LACTATED RINGERS IV SOLN
INTRAVENOUS | Status: DC
  Administered 2015-12-27 – 2015-12-28 (×2): via INTRAVENOUS

## 2015-12-27 NOTE — H&P (Signed)
LABOR AND DELIVERY ADMISSION HISTORY AND PHYSICAL NOTE  Stacey Shepard is a 25 y.o. female 633P2002 with IUP at 5680w6d by US presenting for PROM@1500 .  She reports positive fetal movement. She denies leakage of fluid or vaginal bleeding.  Prenatal History/Complications:  Past Medical History: Past Medical History  Diagnosis Date  . Hx of chlamydia infection     Past Surgical History: Past Surgical History  Procedure Laterality Date  . No past surgeries      Obstetrical History: OB History    Gravida Para Term Preterm AB TAB SAB Ectopic Multiple Living   3 2 2  0 0 0 0 0 0 2      Social History: Social History   Social History  . Marital Status: Single    Spouse Name: N/A  . Number of Children: N/A  . Years of Education: N/A   Social History Main Topics  . Smoking status: Never Smoker   . Smokeless tobacco: Never Used  . Alcohol Use: No  . Drug Use: No  . Sexual Activity: Yes    Birth Control/ Protection: Injection   Other Topics Concern  . None   Social History Narrative    Family History: History reviewed. No pertinent family history.  Allergies: No Known Allergies  No prescriptions prior to admission     Review of Systems   All systems reviewed and negative except as stated in HPI  Blood pressure 117/70, pulse 89, temperature 98.1 F (36.7 C), temperature source Oral, resp. rate 18, last menstrual period 04/06/2015. General appearance: alert, cooperative and no distress Lungs: clear to auscultation bilaterally Heart: regular rate and rhythm Abdomen: soft, non-tender; bowel sounds normal Extremities: No calf swelling or tenderness Presentation: cephalic Fetal monitoring: cat 1 Uterine activity: contracting irregularly  Dilation: 3 Effacement (%): 80 Station: -2 Exam by:: [name] RN    Prenatal labs: ABO, Rh: A/Positive/-- (01/03 0000) Antibody: Negative (01/03 0000) Rubella: !Error! RPR: Nonreactive (04/04 0000)  HBsAg:  Negative (01/03 0000)  HIV: Non-reactive (01/03 0000)  GBS: Negative (06/20 0000)  1 hr Glucola: passed Genetic screening:  declined Anatomy US: normal female  Prenatal Transfer Tool  Maternal Diabetes: No Genetic Screening: Declined Maternal Ultrasounds/Referrals: Normal Fetal Ultrasounds or other Referrals:  None Maternal Substance Abuse:  No Significant Maternal Medications:  None Significant Maternal Lab Results: None  No results found for this or any previous visit (from the past 24 hour(s)).  Patient Active Problem List   Diagnosis Date Noted  . Encounter for supervision of other normal pregnancy in third trimester 12/22/2015    Assessment: Stacey SprayCatrina L Shepard is a 25 y.o. G3P2002 at 2480w6d here for PROM@1500 .  #Labor: PROM@1500 , consider pit once bed is available on BS #Pain: undecided #FWB: Cat 1 #ID:  GBS neg #MOF: breast #MOC: depo  Stacey Shepard 12/27/2015, 8:00 PM  OB FELLOW HISTORY AND PHYSICAL ATTESTATION  I have seen and examined this patient; I agree with above documentation in the resident's note.    Cherrie Gauzeoah B Wouk 12/27/2015, 9:53 PM

## 2015-12-27 NOTE — MAU Note (Signed)
Notified provider that patient is here for ROM at 1500 clear fluid not ctx. GBS +. Provider said to admit patient to L&D.

## 2015-12-27 NOTE — MAU Note (Signed)
Patient presents with PROM at 1500 clear fluid. Patient denies any bleeding or ctxs. Fetus active.

## 2015-12-28 ENCOUNTER — Encounter (HOSPITAL_COMMUNITY): Payer: Self-pay | Admitting: Certified Nurse Midwife

## 2015-12-28 ENCOUNTER — Encounter (HOSPITAL_COMMUNITY): Payer: Self-pay | Admitting: Anesthesiology

## 2015-12-28 DIAGNOSIS — Z3A37 37 weeks gestation of pregnancy: Secondary | ICD-10-CM

## 2015-12-28 DIAGNOSIS — O4292 Full-term premature rupture of membranes, unspecified as to length of time between rupture and onset of labor: Secondary | ICD-10-CM | POA: Diagnosis present

## 2015-12-28 DIAGNOSIS — O4202 Full-term premature rupture of membranes, onset of labor within 24 hours of rupture: Secondary | ICD-10-CM

## 2015-12-28 LAB — RPR: RPR Ser Ql: NONREACTIVE

## 2015-12-28 MED ORDER — ZOLPIDEM TARTRATE 5 MG PO TABS: 5.0000 mg | ORAL_TABLET | Freq: Every evening | ORAL | Status: DC | PRN

## 2015-12-28 MED ORDER — DIPHENHYDRAMINE HCL 25 MG PO CAPS: 25.0000 mg | ORAL_CAPSULE | Freq: Four times a day (QID) | ORAL | Status: DC | PRN

## 2015-12-28 MED ORDER — SIMETHICONE 80 MG PO CHEW: 80.0000 mg | CHEWABLE_TABLET | ORAL | Status: DC | PRN

## 2015-12-28 MED ORDER — COCONUT OIL OIL
1.0000 "application " | TOPICAL_OIL | Status: DC | PRN
  Filled 2015-12-28: qty 120

## 2015-12-28 MED ORDER — DIBUCAINE 1 % RE OINT: 1.0000 "application " | TOPICAL_OINTMENT | RECTAL | Status: DC | PRN

## 2015-12-28 MED ORDER — PHENYLEPHRINE 40 MCG/ML (10ML) SYRINGE FOR IV PUSH (FOR BLOOD PRESSURE SUPPORT)
80.0000 ug | PREFILLED_SYRINGE | INTRAVENOUS | Status: DC | PRN
  Filled 2015-12-28: qty 10
  Filled 2015-12-28: qty 5

## 2015-12-28 MED ORDER — ONDANSETRON HCL 4 MG/2ML IJ SOLN: 4.0000 mg | INTRAMUSCULAR | Status: DC | PRN

## 2015-12-28 MED ORDER — SENNOSIDES-DOCUSATE SODIUM 8.6-50 MG PO TABS
2.0000 | ORAL_TABLET | ORAL | Status: DC
  Administered 2015-12-28 – 2015-12-30 (×2): 2 via ORAL
  Filled 2015-12-28 (×2): qty 2

## 2015-12-28 MED ORDER — SOD CITRATE-CITRIC ACID 500-334 MG/5ML PO SOLN
30.0000 mL | ORAL | Status: DC | PRN
Start: 2015-12-28 — End: 2015-12-29

## 2015-12-28 MED ORDER — OXYCODONE-ACETAMINOPHEN 5-325 MG PO TABS: 1.0000 | ORAL_TABLET | ORAL | Status: DC | PRN

## 2015-12-28 MED ORDER — PRENATAL MULTIVITAMIN CH
1.0000 | ORAL_TABLET | Freq: Every day | ORAL | Status: DC
  Administered 2015-12-28 – 2015-12-29 (×2): 1 via ORAL
  Filled 2015-12-28 (×2): qty 1

## 2015-12-28 MED ORDER — WITCH HAZEL-GLYCERIN EX PADS: 1.0000 "application " | MEDICATED_PAD | CUTANEOUS | Status: DC | PRN

## 2015-12-28 MED ORDER — IBUPROFEN 600 MG PO TABS
600.0000 mg | ORAL_TABLET | Freq: Four times a day (QID) | ORAL | Status: DC
  Administered 2015-12-28 – 2015-12-30 (×8): 600 mg via ORAL
  Filled 2015-12-28 (×8): qty 1

## 2015-12-28 MED ORDER — ONDANSETRON HCL 4 MG/2ML IJ SOLN
4.0000 mg | Freq: Four times a day (QID) | INTRAMUSCULAR | Status: DC | PRN
Start: 2015-12-28 — End: 2015-12-29

## 2015-12-28 MED ORDER — LIDOCAINE HCL (PF) 1 % IJ SOLN
30.0000 mL | INTRAMUSCULAR | Status: AC | PRN
  Administered 2015-12-28: 30 mL via SUBCUTANEOUS
  Filled 2015-12-28: qty 30

## 2015-12-28 MED ORDER — FENTANYL CITRATE (PF) 100 MCG/2ML IJ SOLN
100.0000 ug | INTRAMUSCULAR | Status: DC | PRN
  Administered 2015-12-28: 100 ug via INTRAVENOUS

## 2015-12-28 MED ORDER — LACTATED RINGERS IV SOLN: 500.0000 mL | Freq: Once | INTRAVENOUS | Status: DC

## 2015-12-28 MED ORDER — OXYTOCIN 40 UNITS IN LACTATED RINGERS INFUSION - SIMPLE MED
1.0000 m[IU]/min | INTRAVENOUS | Status: DC
  Administered 2015-12-28: 2 m[IU]/min via INTRAVENOUS

## 2015-12-28 MED ORDER — DIPHENHYDRAMINE HCL 50 MG/ML IJ SOLN: 12.5000 mg | INTRAMUSCULAR | Status: DC | PRN

## 2015-12-28 MED ORDER — ONDANSETRON HCL 4 MG PO TABS: 4.0000 mg | ORAL_TABLET | ORAL | Status: DC | PRN

## 2015-12-28 MED ORDER — FENTANYL 2.5 MCG/ML BUPIVACAINE 1/10 % EPIDURAL INFUSION (WH - ANES)
14.0000 mL/h | INTRAMUSCULAR | Status: DC | PRN
  Filled 2015-12-28: qty 125

## 2015-12-28 MED ORDER — EPHEDRINE 5 MG/ML INJ
10.0000 mg | INTRAVENOUS | Status: DC | PRN
  Filled 2015-12-28: qty 2

## 2015-12-28 MED ORDER — BENZOCAINE-MENTHOL 20-0.5 % EX AERO
1.0000 "application " | INHALATION_SPRAY | CUTANEOUS | Status: DC | PRN
  Administered 2015-12-28: 1 via TOPICAL
  Filled 2015-12-28: qty 56

## 2015-12-28 MED ORDER — FENTANYL CITRATE (PF) 100 MCG/2ML IJ SOLN
INTRAMUSCULAR | Status: AC
  Filled 2015-12-28: qty 2

## 2015-12-28 MED ORDER — FLEET ENEMA 7-19 GM/118ML RE ENEM
1.0000 | ENEMA | RECTAL | Status: DC | PRN
Start: 2015-12-28 — End: 2015-12-29

## 2015-12-28 MED ORDER — PHENYLEPHRINE 40 MCG/ML (10ML) SYRINGE FOR IV PUSH (FOR BLOOD PRESSURE SUPPORT)
80.0000 ug | PREFILLED_SYRINGE | INTRAVENOUS | Status: DC | PRN
  Filled 2015-12-28: qty 5

## 2015-12-28 MED ORDER — TERBUTALINE SULFATE 1 MG/ML IJ SOLN
0.2500 mg | Freq: Once | INTRAMUSCULAR | Status: DC | PRN
  Filled 2015-12-28: qty 1

## 2015-12-28 MED ORDER — ACETAMINOPHEN 325 MG PO TABS: 650.0000 mg | ORAL_TABLET | ORAL | Status: DC | PRN

## 2015-12-28 MED ORDER — OXYCODONE-ACETAMINOPHEN 5-325 MG PO TABS
2.0000 | ORAL_TABLET | ORAL | Status: DC | PRN
Start: 2015-12-28 — End: 2015-12-29
  Administered 2015-12-28 – 2015-12-29 (×2): 2 via ORAL
  Filled 2015-12-28 (×2): qty 2

## 2015-12-28 MED ORDER — TETANUS-DIPHTH-ACELL PERTUSSIS 5-2.5-18.5 LF-MCG/0.5 IM SUSP
0.5000 mL | Freq: Once | INTRAMUSCULAR | Status: AC
  Administered 2015-12-29: 0.5 mL via INTRAMUSCULAR
  Filled 2015-12-28: qty 0.5

## 2015-12-28 NOTE — Lactation Note (Addendum)
This note was copied from a baby's chart. Lactation Consultation Note  Patient Name: [name] [id number]'JToday's Date: 12/28/2015 Reason for consult: Initial assessment  Baby 7 hours old. Mom reports that she has been attempting to latch baby in cradle position, but baby will not cooperate. Assisted mom into the football position and demonstrated how to place pillows. Assisted mom to latch baby to right breast with and baby opened her mouth wide, but would not suckle once on the breast. Discussed newborn behavior, supply and demand, and cluster feeding. Enc mom to keep baby STS, nuzzling at breast, and latch with cues. Assisted mom with hand expression and colostrum present. Mom states that her milk came in with older children. Discussed progression of milk coming to volume.   [facility], aware of OP/BFSG and LC phone line assistance after D/C.  Maternal Data Has patient been taught Hand Expression?: Yes Does the patient have breastfeeding experience prior to this delivery?: Yes  Feeding Feeding Type: Breast Fed Length of feed: 0 min  LATCH Score/Interventions Latch: Too sleepy or reluctant, no latch achieved, no sucking elicited. Intervention(s): Skin to skin;Teach feeding cues  Audible Swallowing: None Intervention(s): Skin to skin;Hand expression  Type of Nipple: Everted at rest and after stimulation  Comfort (Breast/Nipple): Soft / non-tender     Hold (Positioning): Assistance needed to correctly position infant at breast and maintain latch. Intervention(s): Breastfeeding basics reviewed;Support Pillows;Position options;Skin to skin  LATCH Score: 5  Lactation Tools Discussed/Used     Consult Status Consult Status: Follow-up Date: 12/29/15 Follow-up type: In-patient    Geralynn OchsWILLIARD, Tyrece Vanterpool 12/28/2015, 3:47 PM

## 2015-12-28 NOTE — Anesthesia Pain Management Evaluation Note (Signed)
  CRNA Pain Management Visit Note  Patient: Stacey SprayCatrina L Duty, 25 y.o., female  "Hello I am a [facility]. We have an anesthesia team available at all times to provide care throughout the hospital, including epidural management and anesthesia for C-section. I don't know your plan for the delivery whether it a natural birth, water birth, IV sedation, nitrous supplementation, doula or epidural, but we want to meet your pain goals."   1.Was your pain managed to your expectations on prior hospitalizations?   Unable to assess - patient sleeping  2.What is your expectation for pain management during this hospitalization?       3.How can we help you reach that goal?   Record the patient's initial score and the patient's pain goal.   Pain: Patient sleeping - unable to assess  Pain Goal: Patient sleeping - unable to assess [facility] wants you to be able to say your pain was always managed very well.  [facility] LLCEIGHT,Valerie Cones 12/28/2015

## 2015-12-29 ENCOUNTER — Encounter: Payer: Self-pay | Admitting: Certified Nurse Midwife

## 2015-12-29 DIAGNOSIS — Z3A37 37 weeks gestation of pregnancy: Secondary | ICD-10-CM

## 2015-12-29 DIAGNOSIS — O4202 Full-term premature rupture of membranes, onset of labor within 24 hours of rupture: Secondary | ICD-10-CM

## 2015-12-29 NOTE — Lactation Note (Signed)
This note was copied from a baby's chart. Lactation Consultation Note  Patient Name: Stacey Shepard [id number]'UToday's Date: 12/29/2015  Follow up visit.  Mom just finishing a feeding.  She states baby is latching easily and cluster feeding.  Reassured this is normal on day 2-3.  Encouraged to do some breast massage during feeding to increase intake and keep baby engaged.  Encouraged to call for assist/concerns prn.   Maternal Data    Feeding Feeding Type: Breast Fed  LATCH Score/Interventions Latch: Grasps breast easily, tongue down, lips flanged, rhythmical sucking.  Audible Swallowing: A few with stimulation  Type of Nipple: Everted at rest and after stimulation  Comfort (Breast/Nipple): Soft / non-tender     Hold (Positioning): Assistance needed to correctly position infant at breast and maintain latch.  LATCH Score: 8  Lactation Tools Discussed/Used     Consult Status      Huston FoleyMOULDEN, Lanayah Gartley S 12/29/2015, 2:44 PM

## 2015-12-29 NOTE — Progress Notes (Signed)
Post Partum Day #1 Subjective: no complaints, up ad lib, voiding and tolerating PO  Objective: Blood pressure 102/66, pulse 70, temperature 97.2 F (36.2 C), temperature source Oral, resp. rate 18, height 5\' 4"  (1.626 m), weight 151 lb (68.493 kg), last menstrual period 04/06/2015, SpO2 100 %, unknown if currently breastfeeding.  Physical Exam:  General: alert, cooperative and no distress Lochia: appropriate Uterine Fundus: firm Incision: none DVT Evaluation: No evidence of DVT seen on physical exam. No cords or calf tenderness. No significant calf/ankle edema.   Recent Labs  12/27/15 2020  HGB 11.2*  HCT 33.0*    Assessment/Plan: Plan for discharge tomorrow and Breastfeeding   LOS: 2 days   Stacey Shepard, CNM 12/29/2015, 8:21 AM

## 2015-12-29 NOTE — Progress Notes (Signed)
Post Partum Day 1  Subjective:  Stacey SprayCatrina L Finder is a 25 y.o. G3P3003 6550w0d s/p SVD.  No acute events overnight.  Pt denies problems with ambulating, voiding or po intake.  She denies nausea or vomiting.  Pain is well controlled.  She has had flatus. She has not had bowel movement.  Lochia Small.  Plan for birth control is Depo-Provera.  Method of Feeding: breast  Objective: BP 102/66 mmHg  Pulse 70  Temp(Src) 97.2 F (36.2 C) (Oral)  Resp 18  Ht 5\' 4"  (1.626 m)  Wt 68.493 kg (151 lb)  BMI 25.91 kg/m2  SpO2 100%  LMP 04/06/2015  Breastfeeding? Unknown  Physical Exam:  General: alert, cooperative and no distress Lochia:normal flow Chest: CTAB Heart: RRR no m/r/g Abdomen: +BS, soft, nontender, fundus firm at/below umbilicus Uterine Fundus: firm, nontender DVT Evaluation: No evidence of DVT seen on physical exam. Extremities: no edema   Recent Labs  12/27/15 2020  HGB 11.2*  HCT 33.0*    Assessment/Plan:  ASSESSMENT: Stacey Shepard is a 25 y.o. G3P3003 1350w0d ppd #1 s/p NSVD doing well.   Plan for discharge tomorrow and Breastfeeding   LOS: 2 days   Loni MuseKate Timberlake 12/29/2015, 8:03 AM

## 2015-12-30 MED ORDER — OXYCODONE-ACETAMINOPHEN 5-325 MG PO TABS: 1.0000 | ORAL_TABLET | ORAL | Status: DC | PRN

## 2015-12-30 MED ORDER — SENNOSIDES-DOCUSATE SODIUM 8.6-50 MG PO TABS: 2.0000 | ORAL_TABLET | Freq: Two times a day (BID) | ORAL | Status: DC

## 2015-12-30 MED ORDER — IBUPROFEN 600 MG PO TABS: 600.0000 mg | ORAL_TABLET | Freq: Four times a day (QID) | ORAL | Status: DC

## 2015-12-30 MED ORDER — COCONUT OIL OIL: 1.0000 "application " | TOPICAL_OIL | Status: DC | PRN

## 2015-12-30 NOTE — Lactation Note (Signed)
This note was copied from a baby's chart. Lactation Consultation Note; Mom reports baby has been nursing well. Feeding a lot through the night-  on and off for 1 hour. Reassurance given. Mom reports breasts are feeling fuller this morning. Baby asleep at mom's side at present. Reports nipples are a little tender- has coconut oil. Encouraged to rub EBM into nipples after nursing. Has ordered DEBP from insurance company- it should come to her today. Reviewed engorgement prevention and treatment. Asking about milk storage- reviewed guidelines with mom. No further questions at present. To call prn  Patient Name: Stacey Shepard [id number]'XToday's Date: 12/30/2015 Reason for consult: Follow-up assessment   Maternal Data Formula Feeding for Exclusion: No Has patient been taught Hand Expression?: Yes  Feeding    LATCH Score/Interventions                      Lactation Tools Discussed/Used     Consult Status Consult Status: Complete    Pamelia HoitWeeks, Stacey Shepard 12/30/2015, 8:05 AM

## 2015-12-30 NOTE — Discharge Summary (Signed)
Obstetric Discharge Summary Reason for Admission: onset of labor Prenatal Procedures: ultrasound Intrapartum Procedures: spontaneous vaginal delivery Postpartum Procedures: none Complications-Operative and Postpartum: 1st  degree perineal laceration HEMOGLOBIN  Date Value Ref Range Status  12/27/2015 11.2* 12.0 - 15.0 g/dL Final  95/62/130801/08/2015 65.713.6 g/dL Final   HCT  Date Value Ref Range Status  12/27/2015 33.0* 36.0 - 46.0 % Final  06/21/2015 39 % Final    Physical Exam:  General: alert, cooperative and no distress Lochia: appropriate Uterine Fundus: firm Incision: no significant drainage DVT Evaluation: No evidence of DVT seen on physical exam. No cords or calf tenderness. No significant calf/ankle edema.  Discharge Diagnoses: Term Pregnancy-delivered  Discharge Information: Date: 12/30/2015 Activity: pelvic rest Diet: routine Medications: PNV, Ibuprofen, Colace, Iron and Percocet Condition: stable Instructions: refer to practice specific booklet Discharge to: home   Newborn Data: Live born female  Birth Weight: 7 lb 3.9 oz (3286 g) APGAR: 8, 9  Home with mother.  Roe Coombsachelle A Saida Lonon, CNM 12/30/2015, 9:12 AM

## 2016-01-09 ENCOUNTER — Encounter (HOSPITAL_COMMUNITY): Payer: Self-pay | Admitting: Emergency Medicine

## 2016-01-09 ENCOUNTER — Inpatient Hospital Stay (HOSPITAL_COMMUNITY): Payer: Medicaid Other | Admitting: Anesthesiology

## 2016-01-09 ENCOUNTER — Encounter (HOSPITAL_COMMUNITY)
Admission: EM | Disposition: A | Discharge status: Home / Self Care | Payer: Self-pay | Source: Home / Self Care | Attending: Family Medicine

## 2016-01-09 ENCOUNTER — Ambulatory Visit (HOSPITAL_COMMUNITY)
Admission: EM | Admit: 2016-01-09 | Discharge: 2016-01-10 | Disposition: A | Discharge status: Home / Self Care | Payer: Medicaid Other | Attending: Family Medicine | Admitting: Family Medicine

## 2016-01-09 ENCOUNTER — Inpatient Hospital Stay (HOSPITAL_COMMUNITY): Payer: Medicaid Other

## 2016-01-09 DIAGNOSIS — D62 Acute posthemorrhagic anemia: Secondary | ICD-10-CM

## 2016-01-09 DIAGNOSIS — O9081 Anemia of the puerperium: Secondary | ICD-10-CM | POA: Diagnosis not present

## 2016-01-09 HISTORY — PX: DILATION AND CURETTAGE OF UTERUS: SHX78

## 2016-01-09 HISTORY — DX: Other specified health status: Z78.9

## 2016-01-09 LAB — CBC
HCT: 19.9 % — ABNORMAL LOW (ref 36.0–46.0)
HCT: 19.4 % — ABNORMAL LOW (ref 36.0–46.0)
HEMOGLOBIN: 6.6 g/dL — AB (ref 12.0–15.0)
Hemoglobin: 6.7 g/dL — CL (ref 12.0–15.0)
MCH: 30.3 pg (ref 26.0–34.0)
MCH: 30.3 pg (ref 26.0–34.0)
MCHC: 33.7 g/dL (ref 30.0–36.0)
MCHC: 34 g/dL (ref 30.0–36.0)
MCV: 90 fL (ref 78.0–100.0)
MCV: 89 fL (ref 78.0–100.0)
PLATELETS: 264 10*3/uL (ref 150–400)
Platelets: 291 10*3/uL (ref 150–400)
RBC: 2.21 MIL/uL — ABNORMAL LOW (ref 3.87–5.11)
RBC: 2.18 MIL/uL — AB (ref 3.87–5.11)
RDW: 14.2 % (ref 11.5–15.5)
RDW: 14.2 % (ref 11.5–15.5)
WBC: 13.8 10*3/uL — ABNORMAL HIGH (ref 4.0–10.5)
WBC: 19.2 10*3/uL — ABNORMAL HIGH (ref 4.0–10.5)

## 2016-01-09 LAB — TYPE AND SCREEN
ABO/RH(D): A POS
ANTIBODY SCREEN: NEGATIVE

## 2016-01-09 LAB — CBC WITH DIFFERENTIAL/PLATELET
BASOS ABS: 0 10*3/uL (ref 0.0–0.1)
BASOS PCT: 0 %
EOS ABS: 0.1 10*3/uL (ref 0.0–0.7)
Eosinophils Relative: 1 %
HEMATOCRIT: 35.7 % — AB (ref 36.0–46.0)
HEMOGLOBIN: 11.5 g/dL — AB (ref 12.0–15.0)
Lymphocytes Relative: 24 %
Lymphs Abs: 1.8 10*3/uL (ref 0.7–4.0)
MCH: 29.6 pg (ref 26.0–34.0)
MCHC: 32.2 g/dL (ref 30.0–36.0)
MCV: 91.8 fL (ref 78.0–100.0)
MONOS PCT: 5 %
Monocytes Absolute: 0.4 10*3/uL (ref 0.1–1.0)
NEUTROS ABS: 5.2 10*3/uL (ref 1.7–7.7)
NEUTROS PCT: 70 %
Platelets: 355 10*3/uL (ref 150–400)
RBC: 3.89 MIL/uL (ref 3.87–5.11)
RDW: 13.9 % (ref 11.5–15.5)
WBC: 7.6 10*3/uL (ref 4.0–10.5)

## 2016-01-09 LAB — ABO/RH: ABO/RH(D): A POS

## 2016-01-09 LAB — PREPARE RBC (CROSSMATCH)

## 2016-01-09 SURGERY — DILATION AND CURETTAGE
Anesthesia: Monitor Anesthesia Care | Site: Vagina

## 2016-01-09 MED ORDER — DEXAMETHASONE SODIUM PHOSPHATE 10 MG/ML IJ SOLN
INTRAMUSCULAR | Status: AC
Start: 2016-01-09 — End: 2016-01-09
  Filled 2016-01-09: qty 1

## 2016-01-09 MED ORDER — FENTANYL CITRATE (PF) 100 MCG/2ML IJ SOLN
INTRAMUSCULAR | Status: DC | PRN
  Administered 2016-01-09 (×2): 50 ug via INTRAVENOUS

## 2016-01-09 MED ORDER — LIDOCAINE HCL (CARDIAC) 20 MG/ML IV SOLN
INTRAVENOUS | Status: DC | PRN
  Administered 2016-01-09: 100 mg via INTRAVENOUS

## 2016-01-09 MED ORDER — SCOPOLAMINE 1 MG/3DAYS TD PT72
MEDICATED_PATCH | TRANSDERMAL | Status: DC | PRN
  Administered 2016-01-09: 1 via TRANSDERMAL

## 2016-01-09 MED ORDER — PROPOFOL 10 MG/ML IV BOLUS
INTRAVENOUS | Status: DC | PRN
  Administered 2016-01-09: 200 mg via INTRAVENOUS

## 2016-01-09 MED ORDER — SCOPOLAMINE 1 MG/3DAYS TD PT72
MEDICATED_PATCH | TRANSDERMAL | Status: AC
  Filled 2016-01-09: qty 1

## 2016-01-09 MED ORDER — CEFAZOLIN SODIUM-DEXTROSE 2-4 GM/100ML-% IV SOLN
2.0000 g | Freq: Once | INTRAVENOUS | Status: AC
  Administered 2016-01-09: 2 g via INTRAVENOUS

## 2016-01-09 MED ORDER — LIDOCAINE-EPINEPHRINE 1 %-1:100000 IJ SOLN
INTRAMUSCULAR | Status: DC | PRN
  Administered 2016-01-09: 10 mL

## 2016-01-09 MED ORDER — METOCLOPRAMIDE HCL 5 MG/ML IJ SOLN: 10.0000 mg | Freq: Once | INTRAMUSCULAR | Status: DC | PRN

## 2016-01-09 MED ORDER — OXYTOCIN 40 UNITS IN LACTATED RINGERS INFUSION - SIMPLE MED
1.0000 m[IU]/min | INTRAVENOUS | Status: DC
  Administered 2016-01-09: 1 m[IU]/min via INTRAVENOUS
  Filled 2016-01-09: qty 1000

## 2016-01-09 MED ORDER — OXYTOCIN 10 UNIT/ML IJ SOLN
INTRAVENOUS | Status: DC | PRN
  Administered 2016-01-09: 40 [IU] via INTRAVENOUS

## 2016-01-09 MED ORDER — MORPHINE SULFATE (PF) 4 MG/ML IV SOLN
4.0000 mg | Freq: Once | INTRAVENOUS | Status: AC
  Administered 2016-01-09: 4 mg via INTRAVENOUS
  Filled 2016-01-09: qty 1

## 2016-01-09 MED ORDER — SODIUM CHLORIDE 0.9 % IV SOLN
INTRAVENOUS | Status: DC | PRN
  Administered 2016-01-09: 12:00:00 via INTRAVENOUS

## 2016-01-09 MED ORDER — MIDAZOLAM HCL 2 MG/2ML IJ SOLN
INTRAMUSCULAR | Status: AC
  Filled 2016-01-09: qty 2

## 2016-01-09 MED ORDER — MIDAZOLAM HCL 5 MG/5ML IJ SOLN
INTRAMUSCULAR | Status: DC | PRN
  Administered 2016-01-09: 2 mg via INTRAVENOUS

## 2016-01-09 MED ORDER — SODIUM CHLORIDE 0.9 % IV SOLN: Freq: Once | INTRAVENOUS | Status: DC

## 2016-01-09 MED ORDER — DIPHENHYDRAMINE HCL 25 MG PO CAPS
25.0000 mg | ORAL_CAPSULE | Freq: Once | ORAL | Status: AC
  Administered 2016-01-09: 25 mg via ORAL
  Filled 2016-01-09: qty 1

## 2016-01-09 MED ORDER — MEPERIDINE HCL 25 MG/ML IJ SOLN: 6.2500 mg | INTRAMUSCULAR | Status: DC | PRN

## 2016-01-09 MED ORDER — FAMOTIDINE IN NACL 20-0.9 MG/50ML-% IV SOLN
INTRAVENOUS | Status: AC
  Administered 2016-01-09: 20 mg via INTRAVENOUS
  Filled 2016-01-09: qty 50

## 2016-01-09 MED ORDER — OXYCODONE-ACETAMINOPHEN 5-325 MG PO TABS: 1.0000 | ORAL_TABLET | ORAL | Status: DC | PRN

## 2016-01-09 MED ORDER — ACETAMINOPHEN 325 MG PO TABS
650.0000 mg | ORAL_TABLET | Freq: Once | ORAL | Status: AC
  Administered 2016-01-09: 650 mg via ORAL
  Filled 2016-01-09: qty 2

## 2016-01-09 MED ORDER — IBUPROFEN 600 MG PO TABS: 600.0000 mg | ORAL_TABLET | Freq: Four times a day (QID) | ORAL | Status: DC | PRN

## 2016-01-09 MED ORDER — LIDOCAINE HCL (CARDIAC) 20 MG/ML IV SOLN
INTRAVENOUS | Status: AC
  Filled 2016-01-09: qty 5

## 2016-01-09 MED ORDER — ONDANSETRON HCL 4 MG/2ML IJ SOLN
INTRAMUSCULAR | Status: AC
  Filled 2016-01-09: qty 2

## 2016-01-09 MED ORDER — LIDOCAINE-EPINEPHRINE 1 %-1:100000 IJ SOLN
INTRAMUSCULAR | Status: AC
  Filled 2016-01-09: qty 1

## 2016-01-09 MED ORDER — FAMOTIDINE IN NACL 20-0.9 MG/50ML-% IV SOLN
20.0000 mg | Freq: Once | INTRAVENOUS | Status: AC
  Administered 2016-01-09: 20 mg via INTRAVENOUS

## 2016-01-09 MED ORDER — PROPOFOL 10 MG/ML IV BOLUS
INTRAVENOUS | Status: AC
  Filled 2016-01-09: qty 20

## 2016-01-09 MED ORDER — SODIUM CHLORIDE 0.9 % IV BOLUS (SEPSIS)
1000.0000 mL | Freq: Once | INTRAVENOUS | Status: AC
  Administered 2016-01-09: 1000 mL via INTRAVENOUS

## 2016-01-09 MED ORDER — DEXAMETHASONE SODIUM PHOSPHATE 4 MG/ML IJ SOLN
INTRAMUSCULAR | Status: DC | PRN
  Administered 2016-01-09: 10 mg via INTRAVENOUS

## 2016-01-09 MED ORDER — SUGAMMADEX SODIUM 200 MG/2ML IV SOLN
INTRAVENOUS | Status: AC
  Filled 2016-01-09: qty 2

## 2016-01-09 MED ORDER — SODIUM CHLORIDE 0.9 % IV SOLN
Freq: Once | INTRAVENOUS | Status: AC
  Administered 2016-01-09: via INTRAVENOUS

## 2016-01-09 MED ORDER — ACETAMINOPHEN 325 MG PO TABS
650.0000 mg | ORAL_TABLET | Freq: Once | ORAL | Status: DC
Start: 2016-01-09 — End: 2016-01-09

## 2016-01-09 MED ORDER — ONDANSETRON HCL 4 MG/2ML IJ SOLN
4.0000 mg | Freq: Once | INTRAMUSCULAR | Status: AC
  Administered 2016-01-09: 4 mg via INTRAVENOUS
  Filled 2016-01-09: qty 2

## 2016-01-09 MED ORDER — LACTATED RINGERS IV SOLN
INTRAVENOUS | Status: DC
  Administered 2016-01-09: via INTRAVENOUS

## 2016-01-09 MED ORDER — OXYTOCIN 10 UNIT/ML IJ SOLN
INTRAMUSCULAR | Status: AC
  Filled 2016-01-09: qty 4

## 2016-01-09 MED ORDER — FENTANYL CITRATE (PF) 100 MCG/2ML IJ SOLN: 25.0000 ug | INTRAMUSCULAR | Status: DC | PRN

## 2016-01-09 MED ORDER — LACTATED RINGERS IV SOLN
INTRAVENOUS | Status: DC | PRN
  Administered 2016-01-09: 12:00:00 via INTRAVENOUS

## 2016-01-09 MED ORDER — ONDANSETRON HCL 4 MG/2ML IJ SOLN
INTRAMUSCULAR | Status: DC | PRN
  Administered 2016-01-09: 4 mg via INTRAVENOUS

## 2016-01-09 MED ORDER — METHYLERGONOVINE MALEATE 0.2 MG/ML IJ SOLN
0.2000 mg | Freq: Once | INTRAMUSCULAR | Status: AC
  Administered 2016-01-09: 0.2 mg via INTRAMUSCULAR
  Filled 2016-01-09: qty 1

## 2016-01-09 MED ORDER — FENTANYL CITRATE (PF) 100 MCG/2ML IJ SOLN
INTRAMUSCULAR | Status: AC
  Filled 2016-01-09: qty 2

## 2016-01-09 MED ORDER — DIPHENHYDRAMINE HCL 25 MG PO CAPS: 25.0000 mg | ORAL_CAPSULE | Freq: Once | ORAL | Status: DC

## 2016-01-09 MED ORDER — MENTHOL 3 MG MT LOZG: 1.0000 | LOZENGE | OROMUCOSAL | Status: DC | PRN

## 2016-01-09 SURGICAL SUPPLY — 13 items
CATH ROBINSON RED A/P 16FR (CATHETERS) ×2 IMPLANT
CLOTH BEACON ORANGE TIMEOUT ST (SAFETY) ×2 IMPLANT
CONTAINER PREFILL 10% NBF 60ML (FORM) ×4 IMPLANT
DECANTER SPIKE VIAL GLASS SM (MISCELLANEOUS) ×2 IMPLANT
GLOVE BIOGEL PI IND STRL 7.0 (GLOVE) ×2 IMPLANT
GLOVE BIOGEL PI INDICATOR 7.0 (GLOVE) ×2
GLOVE ECLIPSE 7.0 STRL STRAW (GLOVE) ×4 IMPLANT
GOWN STRL REUS W/TWL LRG LVL3 (GOWN DISPOSABLE) ×6 IMPLANT
PACK VAGINAL MINOR WOMEN LF (CUSTOM PROCEDURE TRAY) ×2 IMPLANT
PAD OB MATERNITY 4.3X12.25 (PERSONAL CARE ITEMS) ×2 IMPLANT
PAD PREP 24X48 CUFFED NSTRL (MISCELLANEOUS) ×2 IMPLANT
TOWEL OR 17X24 6PK STRL BLUE (TOWEL DISPOSABLE) ×4 IMPLANT
WATER STERILE IRR 1000ML POUR (IV SOLUTION) ×2 IMPLANT

## 2016-01-09 NOTE — Transfer of Care (Signed)
Immediate Anesthesia Transfer of Care Note  Patient: Stacey Shepard  Procedure(s) Performed: Procedure(s): DILATATION AND CURETTAGE (N/A)  Patient Location: PACU  Anesthesia Type:General  Level of Consciousness: sedated  Airway & Oxygen Therapy: Patient Spontanous Breathing and Patient connected to nasal cannula oxygen  Post-op Assessment: Report given to RN and Post -op Vital signs reviewed and stable  Post vital signs: Reviewed and stable  Last Vitals:  Vitals:   01/09/16 1119 01/09/16 1151  BP: (!) 86/42 94/56  Pulse: 65 83  Resp: 16 18  Temp: 36.8 C     Last Pain:  Vitals:   01/09/16 1119  TempSrc: Oral  PainSc:          Complications: No apparent anesthesia complications

## 2016-01-09 NOTE — ED Notes (Signed)
Pelvic cart at bedside. 

## 2016-01-09 NOTE — ED Provider Notes (Signed)
[facility]-EMERGENCY DEPT Provider Note   CSN: [id number] Arrival date & time: 01/09/16  [id number]  First Provider Contact:  First MD Initiated Contact with Patient 01/09/16 [phone]        History   Chief Complaint Chief Complaint  Patient presents with  . Vaginal Bleeding    2 weeks post delivery    HPI Stacey Shepard is a 25 y.o. female.  Patient is a healthy 25 year old G3 P3 presenting today with excessive vaginal bleeding that started at 5 AM this morning. She is passing numerous clots and having severe lower abdominal cramping. She had a normal vaginal delivery a proximally 2 weeks ago. She did not have excessive bleeding after delivery and only had mild spotting over the last week until today. With her prior to pregnancy she had no complications.  She denies any fever or discharge present prior to the bleeding. She has not been sexually active since delivery.    Vaginal Bleeding  Primary symptoms include pelvic pain, vaginal bleeding. There has been no fever. This is a new problem. Episode onset: 2 hours. The problem occurs constantly. The problem has not changed since onset.The symptoms occur spontaneously. She is not pregnant (recent normal vag delivery 2 weeks ago). The patient's menstrual history has been regular. Associated symptoms include abdominal pain and light-headedness. She has tried nothing for the symptoms. Sexual activity: has not been sexually acitve since delivery. Associated medical issues do not include ectopic pregnancy or Cesarean section.    Past Medical History:  Diagnosis Date  . Hx of chlamydia infection     Patient Active Problem List   Diagnosis Date Noted  . Full-term premature rupture of membranes 12/28/2015  . Encounter for supervision of other normal pregnancy in third trimester 12/22/2015    Past Surgical History:  Procedure Laterality Date  . NO PAST SURGERIES      OB History    Gravida Para Term Preterm AB Living   0 0 3   SAB TAB  Ectopic Multiple Live Births   0 0 0 0         Home Medications    Prior to Admission medications   Medication Sig Start Date End Date Taking? Authorizing Provider  coconut oil OIL Apply 1 application topically as needed. To nipples. 12/30/15   Rachelle A Denney, CNM  ibuprofen (ADVIL,MOTRIN) 600 MG tablet Take 1 tablet (600 mg total) by mouth every 6 (six) hours. 12/30/15   Rachelle A Denney, CNM  oxyCODONE-acetaminophen (ROXICET) 5-325 MG tablet Take 1-2 tablets by mouth every 4 (four) hours as needed for moderate pain or severe pain. 12/30/15   Rachelle A Denney, CNM  senna-docusate (SENOKOT-S) 8.6-50 MG tablet Take 2 tablets by mouth 2 (two) times daily. 12/30/15   Roe Coombs, CNM    Family History No family history on file.  Social History Social History  Substance Use Topics  . Smoking status: Never Smoker  . Smokeless tobacco: Never Used  . Alcohol use No     Allergies   Review of patient's allergies indicates no known allergies.   Review of Systems Review of Systems  Gastrointestinal: Positive for abdominal pain.  Genitourinary: Positive for pelvic pain and vaginal bleeding.  Neurological: Positive for light-headedness.  All other systems reviewed and are negative.    Physical Exam Updated Vital Signs BP 111/80 (BP Location: Left Arm)   Pulse 91   Temp 98.6 F (37 C) (Oral)   Resp 17   Ht  5\' 4"  (1.626 m)   Wt 130 lb (59 kg)   LMP 04/06/2015   SpO2 100%   BMI 22.31 kg/m   Physical Exam  Constitutional: She is oriented to person, place, and time. She appears well-developed and well-nourished. No distress.  HENT:  Head: Normocephalic and atraumatic.  Eyes: EOM are normal. Pupils are equal, round, and reactive to light.  Cardiovascular: Regular rhythm, normal heart sounds and intact distal pulses.  Tachycardia present.  Exam reveals no friction rub.   No murmur heard. Pulmonary/Chest: Effort normal and breath sounds normal. She has no wheezes. She  has no rales.  Abdominal: Soft. Bowel sounds are normal. She exhibits no distension. There is tenderness. There is no rebound and no guarding.  Pelvic tenderness  Genitourinary: Uterus is enlarged. There is bleeding in the vagina.  Genitourinary Comments: Copious amounts of bright red blood and clot present in the vaginal vault. On bimanual exam the cervical loss is 2-3 cm. Unable to visualize the os due to excessive bleeding despite attempting using gauze to remove the blood to visualize the os.  Unable to see if there are any products present at the os  Musculoskeletal: Normal range of motion. She exhibits no tenderness.  No edema  Neurological: She is alert and oriented to person, place, and time. No cranial nerve deficit.  Skin: Skin is warm and dry. No rash noted.  Psychiatric: She has a normal mood and affect. Her behavior is normal.  Nursing note and vitals reviewed.    ED Treatments / Results  Labs (all labs ordered are listed, but only abnormal results are displayed) Labs Reviewed  CBC WITH DIFFERENTIAL/PLATELET - Abnormal; Notable for the following:       Result Value   Hemoglobin 11.5 (*)    HCT 35.7 (*)    All other components within normal limits  I-STAT CHEM 8, ED  TYPE AND SCREEN    EKG  EKG Interpretation None       Radiology No results found.  Procedures Procedures (including critical care time)  Medications Ordered in ED Medications - No data to display   Initial Impression / Assessment and Plan / ED Course  I have reviewed the triage vital signs and the nursing notes.  Pertinent labs & imaging results that were available during my care of the patient were reviewed by me and considered in my medical decision making (see chart for details).  Clinical Course   Patient is a healthy 25 year old female G3 P3 presenting today with excessive vaginal bleeding that started at 5 AM this morning. Upon arrival here patient is passing large amount of clot  continuously. She is having some lower abdominal cramping but is hemodynamically stable. On pelvic exam unable to decrease the bleeding enough to evaluate if there are any products present at the os.  On bimanual exam patient is approximately 2-3 cm dilated. With uterine massage large amount of bleeding and clot evacuated however bleeding continues. Patient was started on a Pitocin drip. Spoke with Dr. Shawnie Pons who recommended patient be transported to women's. Currently she is hemodynamically stable. Initial hemoglobin here was 11.5 which is the same as discharge 2 weeks ago after a normal vaginal delivery.  CRITICAL CARE Performed by: Gwyneth Sprout Total critical care time: 30 minutes Critical care time was exclusive of separately billable procedures and treating other patients. Critical care was necessary to treat or prevent imminent or life-threatening deterioration. Critical care was time spent personally by me on the following activities: development  of treatment plan with patient and/or surrogate as well as nursing, discussions with consultants, evaluation of patient's response to treatment, examination of patient, obtaining history from patient or surrogate, ordering and performing treatments and interventions, ordering and review of laboratory studies, ordering and review of radiographic studies, pulse oximetry and re-evaluation of patient's condition.    Final Clinical Impressions(s) / ED Diagnoses   Final diagnoses:  Delayed postpartum hemorrhage    New Prescriptions New Prescriptions   No medications on file   .   Gwyneth Sprout, MD 01/09/16 [phone]

## 2016-01-09 NOTE — MAU Note (Signed)
IV in L antecubital converted to saline lock.

## 2016-01-09 NOTE — ED Notes (Signed)
[facility] left room at this time.

## 2016-01-09 NOTE — Anesthesia Postprocedure Evaluation (Signed)
Anesthesia Post Note  Patient: Stacey Shepard  Procedure(s) Performed: Procedure(s) (LRB): DILATATION AND CURETTAGE (N/A)  Patient location during evaluation: [facility] Anesthesia Type: General Level of consciousness: awake, awake and alert, oriented and patient cooperative Pain management: pain level controlled Vital Signs Assessment: post-procedure vital signs reviewed and stable Respiratory status: spontaneous breathing, nonlabored ventilation and respiratory function stable Cardiovascular status: stable Postop Assessment: no headache, no backache, no signs of nausea or vomiting and patient able to bend at knees Anesthetic complications: no     Last Vitals:  Vitals:   01/09/16 1409 01/09/16 1435  BP:  104/63  Pulse: (!) 104 87  Resp: 15 16  Temp: 37.2 C 36.7 C    Last Pain:  Vitals:   01/09/16 1445  TempSrc:   PainSc: 0-No pain   Pain Goal: Patients Stated Pain Goal: 3 (01/09/16 1445)               [name] L

## 2016-01-09 NOTE — MAU Provider Note (Signed)
History     CSN: [id number]  Arrival date and time: 01/09/16 1572   First Provider Initiated Contact with Patient 01/09/16 [phone]      Chief Complaint  Patient presents with  . Vaginal Bleeding    2 weeks post delivery   HPI Stacey Shepard is a 25 y.o. [phone] who presents 2 weeks s/p SVD for vaginal bleeding. States bleeding since delivery had stopped until this morning. Woke up this AM with large amount of bright red bleeding & several clots. Also reports lower abdominal cramping that she rates 8/10. Has not treated. Denies fever/chills. Denies complications with delivery & no PPH with previous deliveries.   OB History    Gravida Para Term Preterm AB Living   3 3 3  0 0 3   SAB TAB Ectopic Multiple Live Births   0 0 0 0 3      Past Medical History:  Diagnosis Date  . Hx of chlamydia infection     Past Surgical History:  Procedure Laterality Date  . NO PAST SURGERIES      No family history on file.  Social History  Substance Use Topics  . Smoking status: Never Smoker  . Smokeless tobacco: Never Used  . Alcohol use No    Allergies: No Known Allergies  Prescriptions Prior to Admission  Medication Sig Dispense Refill Last Dose  . coconut oil OIL Apply 1 application topically as needed. To nipples. 500 mL PRN   . ibuprofen (ADVIL,MOTRIN) 600 MG tablet Take 1 tablet (600 mg total) by mouth every 6 (six) hours. 120 tablet 2   . oxyCODONE-acetaminophen (ROXICET) 5-325 MG tablet Take 1-2 tablets by mouth every 4 (four) hours as needed for moderate pain or severe pain. 30 tablet 0   . senna-docusate (SENOKOT-S) 8.6-50 MG tablet Take 2 tablets by mouth 2 (two) times daily. 90 tablet PRN     Review of Systems  Constitutional: Negative for chills and fever.  Gastrointestinal: Positive for abdominal pain.  Genitourinary:       + vaginal bleeding   Physical Exam   Blood pressure (!) 94/54, pulse 86, temperature 97.6 F (36.4 C), temperature source Oral, resp. rate  18, height 5\' 4"  (1.626 m), weight 130 lb (59 kg), last menstrual period 04/06/2015, SpO2 100 %, currently breastfeeding. Patient Vitals for the past 24 hrs:  BP Temp Temp src Pulse Resp SpO2 Height Weight  01/09/16 1016 96/64 97.5 F (36.4 C) Oral 92 18 100 % - -  01/09/16 0955 (!) 94/54 - - - 18 - - -  01/09/16 0945 (!) 75/37 97.6 F (36.4 C) Oral 86 17 - - -  01/09/16 0910 (!) 90/50 - - (!) 53 20 100 % - -  01/09/16 0900 107/69 98.1 F (36.7 C) - 84 12 100 % - -  01/09/16 0828 108/60 - - - - - - -  01/09/16 0817 105/76 - - 90 15 100 % - -  01/09/16 0802 112/79 - - 85 17 100 % - -  01/09/16 0759 111/80 98.6 F (37 C) Oral 91 17 100 % - -  01/09/16 0740 114/69 98.2 F (36.8 C) Oral 104 17 100 % 5\' 4"  (1.626 m) 130 lb (59 kg)    Physical Exam  Nursing note and vitals reviewed. Constitutional: She is oriented to person, place, and time. She appears well-developed and well-nourished. No distress.  HENT:  Head: Normocephalic and atraumatic.  Eyes: Conjunctivae are normal. Right eye exhibits no discharge. Left  eye exhibits no discharge. No scleral icterus.  Neck: Normal range of motion.  Cardiovascular: Normal rate, regular rhythm and normal heart sounds.   No murmur heard. Respiratory: Effort normal and breath sounds normal. No respiratory distress. She has no wheezes.  GI: Soft. There is tenderness.  Genitourinary: Uterus is enlarged and tender.  Genitourinary Comments: Moderate amount of bright red blood & several large clots removed from vagina.  Fox swabs x 3 & 2x2 gauze saturated. Bright red bleeding continues.  Difficult to visualize cervix d/t pt's discomfort & amount of bleeding  Neurological: She is alert and oriented to person, place, and time.  Skin: Skin is warm and dry. She is not diaphoretic.  Psychiatric: She has a normal mood and affect. Her behavior is normal. Judgment and thought content normal.    MAU Course  Procedures Results for orders placed or performed  during the hospital encounter of 01/09/16 (from the past 24 hour(s))  CBC with Differential/Platelet     Status: Abnormal   Collection Time: 01/09/16  7:55 AM  Result Value Ref Range   WBC 7.6 4.0 - 10.5 K/uL   RBC 3.89 3.87 - 5.11 MIL/uL   Hemoglobin 11.5 (L) 12.0 - 15.0 g/dL   HCT 16.1 (L) 09.6 - 04.5 %   MCV 91.8 78.0 - 100.0 fL   MCH 29.6 26.0 - 34.0 pg   MCHC 32.2 30.0 - 36.0 g/dL   RDW 40.9 81.1 - 91.4 %   Platelets 355 150 - 400 K/uL   Neutrophils Relative % 70 %   Neutro Abs 5.2 1.7 - 7.7 K/uL   Lymphocytes Relative 24 %   Lymphs Abs 1.8 0.7 - 4.0 K/uL   Monocytes Relative 5 %   Monocytes Absolute 0.4 0.1 - 1.0 K/uL   Eosinophils Relative 1 %   Eosinophils Absolute 0.1 0.0 - 0.7 K/uL   Basophils Relative 0 %   Basophils Absolute 0.0 0.0 - 0.1 K/uL  Type and screen     Status: None   Collection Time: 01/09/16  7:55 AM  Result Value Ref Range   ABO/RH(D) A POS    Antibody Screen NEG    Sample Expiration 01/12/2016   ABO/Rh     Status: None   Collection Time: 01/09/16  7:55 AM  Result Value Ref Range   ABO/RH(D) A POS   Type and screen     Status: None (Preliminary result)   Collection Time: 01/09/16 10:44 AM  Result Value Ref Range   ABO/RH(D) A POS    Antibody Screen NEG    Sample Expiration 01/12/2016    Unit Number [id number]    Blood Component Type RED CELLS,LR    Unit division 00    Status of Unit ALLOCATED    Transfusion Status OK TO TRANSFUSE    Crossmatch Result Compatible    Unit Number [id number]    Blood Component Type RED CELLS,LR    Unit division 00    Status of Unit ALLOCATED    Transfusion Status OK TO TRANSFUSE    Crossmatch Result Compatible   CBC     Status: Abnormal   Collection Time: 01/09/16 10:45 AM  Result Value Ref Range   WBC 13.8 (H) 4.0 - 10.5 K/uL   RBC 2.21 (L) 3.87 - 5.11 MIL/uL   Hemoglobin 6.7 (LL) 12.0 - 15.0 g/dL   HCT 32.4 (L) 40.1 - 02.7 %   MCV 90.0 78.0 - 100.0 fL   MCH 30.3 26.0 - 34.0 pg  MCHC 33.7 30.0 -  36.0 g/dL   RDW 16.1 09.6 - 04.5 %   Platelets 264 150 - 400 K/uL  Prepare RBC     Status: None   Collection Time: 01/09/16 12:00 PM  Result Value Ref Range   Order Confirmation ORDER PROCESSED BY BLOOD BANK    US Pelvis Complete  Result Date: 01/09/2016 CLINICAL DATA:  Postpartum bleeding. EXAM: TRANSABDOMINAL AND TRANSVAGINAL ULTRASOUND OF PELVIS TECHNIQUE: Both transabdominal and transvaginal ultrasound examinations of the pelvis were performed. Transabdominal technique was performed for global imaging of the pelvis including uterus, ovaries, adnexal regions, and pelvic cul-de-sac. It was necessary to proceed with endovaginal exam following the transabdominal exam to visualize the uterus and ovaries. COMPARISON:  11/21/2010. FINDINGS: Uterus Measurements: 16.9 x 6.4 by 6.6 cm. No fibroids or other mass visualized. Endometrium Thickness: 30.0. Thickened inter endometrium. Retained products of conception cannot be excluded . Right ovary Measurements: 4.7 x 2.0 x 2.5 cm. Normal appearance/no adnexal mass. Left ovary Measurements: 2.1 x 2.3 x 3.6 cm. Normal appearance/no adnexal mass. Other findings Trace free pelvic fluid. IMPRESSION: Thickened heterogeneous endometrium. Retained products of conception cannot be excluded. Trace free pelvic fluid. Electronically Signed   By: Maisie Fus  Register   On: 01/09/2016 10:50   MDM Pt hypotensive upon arrival from ED -- IV fluid bolus continued. Increased rate of IV pitocin.  CBC & type/screen, ultrasound at bedside S/w Dr. Shawnie Pons regarding VS, assessment, & ultrasound report. Will come see patient.   Assessment and Plan  A: 1. Delayed postpartum hemorrhage   2. Postpartum bleeding   3. Acute blood loss anemia     P: Dr. Shawnie Pons to see patient   Judeth Horn 01/09/2016, 10:05 AM

## 2016-01-09 NOTE — Anesthesia Procedure Notes (Signed)
Procedure Name: LMA Insertion Date/Time: 01/09/2016 12:07 PM Performed by: Junious Silk Pre-anesthesia Checklist: Patient identified, Emergency Drugs available, Suction available, Patient being monitored and Timeout performed Patient Re-evaluated:Patient Re-evaluated prior to inductionOxygen Delivery Method: Circle system utilized Preoxygenation: Pre-oxygenation with 100% oxygen Intubation Type: IV induction LMA: LMA inserted LMA Size: 4.0 Number of attempts: 1 Placement Confirmation: positive ETCO2,  CO2 detector and breath sounds checked- equal and bilateral Tube secured with: Tape Dental Injury: Teeth and Oropharynx as per pre-operative assessment

## 2016-01-09 NOTE — Progress Notes (Signed)
CRITICAL VALUE ALERT  Critical value received:  HgB 6.6  Date of notification:  01/09/16   Time of notification: 1800  Critical value read back: yes  Nurse who received alert: Glory Rosebush   MD notified (1st page):  Shawnie Pons @[phone]   See order for transfusion.

## 2016-01-09 NOTE — ED Notes (Signed)
Pt started to have significant pain and a sizeable clot was passed. Pt became pale and started to complain of being nauseous. Pt started to dry heave and HR dropped to 50s. MD made aware and patient's BP was checked Manually. MD at the bedside.

## 2016-01-09 NOTE — Progress Notes (Signed)
Patient attempted to get up to void, but was too light headed. VSS. Hgb is stable but such a big drop in short time, patient will need blood. Consent obtained. Needs lactation and breast pump.

## 2016-01-09 NOTE — Anesthesia Preprocedure Evaluation (Signed)
Anesthesia Evaluation  Patient identified by MRN, date of birth, ID band Patient awake    Reviewed: Allergy & Precautions, NPO status , Patient's Chart, lab work & pertinent test results  Airway Mallampati: III  TM Distance: >3 FB Neck ROM: Full    Dental no notable dental hx. (+) Teeth Intact   Pulmonary neg pulmonary ROS,    Pulmonary exam normal breath sounds clear to auscultation       Cardiovascular negative cardio ROS Normal cardiovascular exam Rhythm:Regular     Neuro/Psych negative neurological ROS  negative psych ROS   GI/Hepatic negative GI ROS, Neg liver ROS,   Endo/Other  negative endocrine ROS  Renal/GU negative Renal ROS  negative genitourinary   Musculoskeletal negative musculoskeletal ROS (+)   Abdominal   Peds  Hematology  (+) anemia , Acute blood loss   Anesthesia Other Findings   Reproductive/Obstetrics Delayed post partum hemorrhage Retained POC                             Anesthesia Physical Anesthesia Plan  ASA: II and emergent  Anesthesia Plan: General   Post-op Pain Management:    Induction: Intravenous  Airway Management Planned: LMA  Additional Equipment:   Intra-op Plan:   Post-operative Plan:   Informed Consent: I have reviewed the patients History and Physical, chart, labs and discussed the procedure including the risks, benefits and alternatives for the proposed anesthesia with the patient or authorized representative who has indicated his/her understanding and acceptance.   Dental advisory given  Plan Discussed with: Surgeon, CRNA and Anesthesiologist  Anesthesia Plan Comments:         Anesthesia Quick Evaluation

## 2016-01-09 NOTE — Addendum Note (Signed)
Addendum  created 01/09/16 1529 by Rache Klimaszewski L Brendalee Matthies, CRNA   Sign clinical note    

## 2016-01-09 NOTE — Progress Notes (Signed)
CRITICAL VALUE ALERT  Critical value received: Hgb 6.7  Date of notification:  01/09/16  Time of notification:  1106  Critical value read back:Yes.    Nurse who received alert:  Ninfa Meeker RN  MD notified (1st page):  [phone] - verbally reported to [facility]. Williams CNM  Time of first page:  1108  MD notified (2nd page):N/A Time of second page:  Responding MD:  N/A  Time MD responded:  N/A

## 2016-01-09 NOTE — H&P (Addendum)
Stacey Shepard is an 25 y.o. [phone] female.    Chief Complaint: vaginal bleeding  HPI: Patient presents 2 weeks s/p SVD for vaginal bleeding. States bleeding since delivery had stopped until this morning. Woke up this AM with large amount of bright red bleeding & several clots. Also reports lower abdominal cramping that she rates 8/10. Has not treated. Denies fever/chills. Denies complications with delivery & no PPH with previous deliveries.    Past Medical History:  Diagnosis Date  . Hx of chlamydia infection   . Medical history non-contributory     Past Surgical History:  Procedure Laterality Date  . NO PAST SURGERIES      Family History  Problem Relation Age of Onset  . Cancer Paternal Aunt     stomach   Social History:  reports that she has never smoked. She has never used smokeless tobacco. She reports that she does not drink alcohol or use drugs.  Allergies: No Known Allergies  No current facility-administered medications on file prior to encounter.    Current Outpatient Prescriptions on File Prior to Encounter  Medication Sig Dispense Refill  . ibuprofen (ADVIL,MOTRIN) 600 MG tablet Take 1 tablet (600 mg total) by mouth every 6 (six) hours. 120 tablet 2  . coconut oil OIL Apply 1 application topically as needed. To nipples. (Patient not taking: Reported on 01/09/2016) 500 mL PRN  . oxyCODONE-acetaminophen (ROXICET) 5-325 MG tablet Take 1-2 tablets by mouth every 4 (four) hours as needed for moderate pain or severe pain. (Patient not taking: Reported on 01/09/2016) 30 tablet 0  . senna-docusate (SENOKOT-S) 8.6-50 MG tablet Take 2 tablets by mouth 2 (two) times daily. (Patient not taking: Reported on 01/09/2016) 90 tablet PRN    Pertinent items are noted in HPI.  Blood pressure (!) 86/42, pulse 65, temperature 98.2 F (36.8 C), temperature source Oral, resp. rate 16, height 5\' 4"  (1.626 m), weight 130 lb (59 kg), last menstrual period 04/06/2015, SpO2 98 %, currently  breastfeeding. BP (!) 86/42 (BP Location: Left Arm)   Pulse 65   Temp 98.2 F (36.8 C) (Oral)   Resp 16   Ht 5\' 4"  (1.626 m)   Wt 130 lb (59 kg)   LMP 04/06/2015   SpO2 98%   Breastfeeding? Yes   BMI 22.31 kg/m  General appearance: appears stated age and pale Head: Normocephalic, without obvious abnormality, atraumatic Neck: supple, symmetrical, trachea midline Lungs: normal effort Abdomen: gravid uterus Extremities: Homans sign is negative, no sign of DVT Skin: pale in appearance Neurologic: Grossly normal   Lab Results  Component Value Date   WBC 13.8 (H) 01/09/2016   HGB 6.7 (LL) 01/09/2016   HCT 19.9 (L) 01/09/2016   MCV 90.0 01/09/2016   PLT 264 01/09/2016   Lab Results  Component Value Date   PREGTESTUR POSITIVE (A) 11/29/2011     Assessment/Plan Active Problems:   Delayed postpartum hemorrhage   Acute blood loss anemia  Type and Cross and transfuse as needed. To OR for Dilation and Curettage and indicated procedures. May need Bakri placement and/or abdominal hysterectomy--discussed with patient. Risks include but are not limited to bleeding, infection, injury to surrounding structures, including bowel, bladder and ureters, blood clots, and death.  Likelihood of success is high.    Eugenia Eldredge S 01/09/2016, 11:31 AM

## 2016-01-09 NOTE — Op Note (Signed)
Preoperative diagnosis: Postpartum hemorrhage  Postoperative diagnosis: Same, retained POC  Procedure: Exam under anesthesia, Dilation and Curettage  Surgeon: Shelbie Proctor. Shawnie Pons, M.D.  Anesthesia: MAC, Malen Gauze, MD  Findings: Open cervix and boggy uterus  EBL: 200 cc  Reason for procedure: Patient is a 25 y.o. [id number] who is 2 wks postpartum from an SVD and she has begun bleeding. Hgb dropped from 11.5-6.7 over 4 hours. Bleeding continues despite Pitocin. U/s shows 3 cm endometrial stripe with possible retained products.  Procedure: Patient was placed in dorsal lithotomy after spinal analgesia. A Foley catheter was in place. A timeout was performed. The vagina was prepped. Cervix was open with large amount of clot noted. Banjo Curette used to remove blood clots, tissue until gritty texture noted in all quadrants. Bimanual massage revealed firm uterus approximately 12-14 wk size. Pitocin given. Patient tolerated the procedure well. All instrument, needle, and lap counts were correct x 2.  Reva Bores, MD 01/09/2016, 1:13 PM

## 2016-01-09 NOTE — MAU Note (Signed)
Checked Patients bleeding and changed pad that was completely saturated.

## 2016-01-09 NOTE — ED Triage Notes (Signed)
Pt. Stated, I started passing clots this morning around 0500 and continuous to bleed.

## 2016-01-09 NOTE — Anesthesia Postprocedure Evaluation (Signed)
Anesthesia Post Note  Patient: Stacey Shepard  Procedure(s) Performed: Procedure(s) (LRB): DILATATION AND CURETTAGE (N/A)  Patient location during evaluation: PACU Anesthesia Type: General Level of consciousness: awake and alert and oriented Pain management: pain level controlled Vital Signs Assessment: post-procedure vital signs reviewed and stable Respiratory status: spontaneous breathing, nonlabored ventilation and respiratory function stable Cardiovascular status: blood pressure returned to baseline and stable Postop Assessment: no signs of nausea or vomiting Anesthetic complications: no     Last Vitals:  Vitals:   01/09/16 1300 01/09/16 1315  BP: 104/65 99/63  Pulse: 84 87  Resp: 13 13  Temp:      Last Pain:  Vitals:   01/09/16 1315  TempSrc:   PainSc: Asleep   Pain Goal:                 Ibn Stief A.

## 2016-01-09 NOTE — MAU Note (Signed)
Pt transferred from [facility] via [facility].  PP vag delivery.(7/12)  States bleeding had almost stopped,  Pads soaked on arrival, weighed - net EBL 245gm.   RN transfer member stated pads were changed prior to transfer.denies any change in activity.  Denies sexual intercourse. States had gotten up to go to bathroom, did have BM, denies straining.

## 2016-01-10 DIAGNOSIS — D62 Acute posthemorrhagic anemia: Secondary | ICD-10-CM

## 2016-01-10 DIAGNOSIS — O9081 Anemia of the puerperium: Secondary | ICD-10-CM

## 2016-01-10 LAB — TYPE AND SCREEN
ABO/RH(D): A POS
ANTIBODY SCREEN: NEGATIVE
UNIT DIVISION: 0
UNIT DIVISION: 0

## 2016-01-10 LAB — CBC
HEMATOCRIT: 22.3 % — AB (ref 36.0–46.0)
HEMOGLOBIN: 8 g/dL — AB (ref 12.0–15.0)
MCH: 30.9 pg (ref 26.0–34.0)
MCHC: 35.9 g/dL (ref 30.0–36.0)
MCV: 86.1 fL (ref 78.0–100.0)
Platelets: 206 10*3/uL (ref 150–400)
RBC: 2.59 MIL/uL — ABNORMAL LOW (ref 3.87–5.11)
RDW: 15.5 % (ref 11.5–15.5)
WBC: 16.9 10*3/uL — ABNORMAL HIGH (ref 4.0–10.5)

## 2016-01-10 MED ORDER — FERROUS SULFATE 325 (65 FE) MG PO TABS: 325.0000 mg | ORAL_TABLET | Freq: Two times a day (BID) | ORAL | 3 refills | Status: DC

## 2016-01-10 MED ORDER — IBUPROFEN 600 MG PO TABS: 600.0000 mg | ORAL_TABLET | Freq: Four times a day (QID) | ORAL | 0 refills | Status: DC | PRN

## 2016-01-10 NOTE — Lactation Note (Signed)
Lactation Consultation Note RN call ed LC w/MD order to see pt. For lactation, engorgement, pumping, BF etc. Moms breast were getting engorged. Wearing bra, has had ice applied. Mom had told RN she didn't want to BF or pump, she wanted to bottle/formula feed. RN wanted LC to discussed this with mom. Mom stated she just wanted to formula/bottle feed. Mom had been pumping and giving to baby. Mom had pumped earlier tonight, gave to baby. Dicussed engorgement management. Mom had ICED earlier. Will order cabbage leaves to put into bra. No more stimulation or pumping of breast. Mom knows the benefits of BF.  Patient Name: Stacey Shepard [id number] Date: 01/10/2016     Maternal Data    Feeding    LATCH Score/Interventions                      Lactation Tools Discussed/Used     Consult Status      Charyl Dancer 01/10/2016, 3:43 AM

## 2016-01-10 NOTE — Discharge Instructions (Signed)
Postpartum Hemorrhage °Postpartum hemorrhage is excessive blood loss after childbirth. Some blood loss is normal after delivering a baby. However, postpartum hemorrhage is a potentially serious condition.  °CAUSES  °· A loss of muscle tone in the uterus after childbirth. °· Failure to deliver all of the placenta. °· Wounds in the birth canal caused by delivery of the fetus. °· A maternal bleeding disorder that prevents blood clotting (rare). °RISK FACTORS °You are at greater risk for postpartum hemorrhage if you: °· Have a history of postpartum hemorrhage. °· Have delivered more than one baby. °· Had preeclampsia or eclampsia. °· Had problems with the placenta. °· Had complications during your labor or delivery. °· Are obese. °· Are Asian or Hispanic. °SIGNS AND SYMPTOMS  °Vaginal bleeding after delivery is normal and should be expected. Bleeding (lochia) will occur for several days after childbirth. This can be expected with normal vaginal deliveries and cesarean deliveries.  °You are bleeding too much after your delivery if you are: °· Passing large clots or pieces of tissue. This may be small pieces of placenta left after delivery.   °· Soaking more than one sanitary pad per hour for several hours.   °· Having heavy, bright-red bleeding that occurs 4 days or more after delivery.   °· Having a discharge that has a bad smell or if you begin to run an unexplained fever.   °· Having times of lightheadedness or fainting, feeling short of breath, or having your heart beat fast with very little activity.   °DIAGNOSIS  °A diagnosis is based on your symptoms and a physical exam of your perineum, vagina, cervix, and uterus. Diagnostic tests may include: °· Blood pressure and pulse. °· Blood tests. °· Blood clotting tests. °· Ultrasonography. °TREATMENT °· Treatment is based on the severity of bleeding and may include: °¨ Uterine massage. °¨ Medicines. °¨ Blood transfusions. °· Sometimes bleeding occurs if portions of the  placenta are left behind in the uterus after delivery. If this happens, often a curettage or scraping of the inside of the uterus must be done. This usually stops the bleeding. If this treatment does not stop the bleeding, surgery (hysterectomy) may have to be performed to remove the uterus. °· If bleeding is due to clotting or bleeding problems that are not related to the pregnancy, other treatments may be needed.   °HOME CARE INSTRUCTIONS  °· Limit your activity as directed by your health care provider. Your health care provider may order bed rest (getting up to the bathroom only) or may allow you to continue light activity.   °· Keep track of the number of pads you use each day and how soaked (saturated) they are. Write this number down.   °· Do not use tampons. Do not douche or have sexual intercourse until approved by your health care provider.   °· Drink enough fluids to keep your urine clear or pale yellow.   °· Get proper amounts of rest.   °· Eat foods that are rich in iron, such as spinach, red meat, and legumes.   °SEEK IMMEDIATE MEDICAL CARE IF: °· You experience severe cramps in your stomach, back, or belly (abdomen).   °· You have a fever.   °· You pass large clots or tissue. Save any tissue for your health care provider to look at.   °· Your bleeding increases. °· You become weak or lightheaded, or you pass out.   °· Your sanitary pad count per hour is increasing. °MAKE SURE YOU: °· Understand these instructions. °· Will watch your condition. °· Will get help right away if   you are not doing well or get worse.   This information is not intended to replace advice given to you by your health care provider. Make sure you discuss any questions you have with your health care provider.   Document Released: 08/25/2003 Document Revised: 06/09/2013 Document Reviewed: 11/20/2012 Elsevier Interactive Patient Education 2016 [employer].  Anemia, Nonspecific Anemia is a condition in which the  concentration of red blood cells or hemoglobin in the blood is below normal. Hemoglobin is a substance in red blood cells that carries oxygen to the tissues of the body. Anemia results in not enough oxygen reaching these tissues.  CAUSES  Common causes of anemia include:   Excessive bleeding. Bleeding may be internal or external. This includes excessive bleeding from periods (in women) or from the intestine.   Poor nutrition.   Chronic kidney, thyroid, and liver disease.  Bone marrow disorders that decrease red blood cell production.  Cancer and treatments for cancer.  HIV, AIDS, and their treatments.  Spleen problems that increase red blood cell destruction.  Blood disorders.  Excess destruction of red blood cells due to infection, medicines, and autoimmune disorders. SIGNS AND SYMPTOMS   Minor weakness.   Dizziness.   Headache.  Palpitations.   Shortness of breath, especially with exercise.   Paleness.  Cold sensitivity.  Indigestion.  Nausea.  Difficulty sleeping.  Difficulty concentrating. Symptoms may occur suddenly or they may develop slowly.  DIAGNOSIS  Additional blood tests are often needed. These help your health care provider determine the best treatment. Your health care provider will check your stool for blood and look for other causes of blood loss.  TREATMENT  Treatment varies depending on the cause of the anemia. Treatment can include:   Supplements of iron, vitamin B12, or folic acid.   Hormone medicines.   A blood transfusion. This may be needed if blood loss is severe.   Hospitalization. This may be needed if there is significant continual blood loss.   Dietary changes.  Spleen removal. HOME CARE INSTRUCTIONS Keep all follow-up appointments. It often takes many weeks to correct anemia, and having your health care provider check on your condition and your response to treatment is very important. SEEK IMMEDIATE MEDICAL CARE  IF:   You develop extreme weakness, shortness of breath, or chest pain.   You become dizzy or have trouble concentrating.  You develop heavy vaginal bleeding.   You develop a rash.   You have bloody or black, tarry stools.   You faint.   You vomit up blood.   You vomit repeatedly.   You have abdominal pain.  You have a fever or persistent symptoms for more than 2-3 days.   You have a fever and your symptoms suddenly get worse.   You are dehydrated.  MAKE SURE YOU:  Understand these instructions.  Will watch your condition.  Will get help right away if you are not doing well or get worse.   This information is not intended to replace advice given to you by your health care provider. Make sure you discuss any questions you have with your health care provider.   Document Released: 07/12/2004 Document Revised: 02/04/2013 Document Reviewed: 11/28/2012 Elsevier Interactive Patient Education [employer]

## 2016-01-10 NOTE — Progress Notes (Signed)
Pt ambulated out with family and nurse  Teaching complete

## 2016-01-10 NOTE — Discharge Summary (Signed)
OB Discharge Summary     Patient Name: Stacey Shepard DOB: Mar 19, 1991 MRN: [id number]  Date of admission: 01/09/2016  Date of discharge: 01/10/2016  Admitting diagnosis: Delayed postpartum hemorrhage [O72.2]    Secondary diagnosis:  Active Problems:   Delayed postpartum hemorrhage   Acute blood loss anemia   Postpartum hemorrhage  Additional problems: none     Discharge diagnosis: PPH and delayed pph and blood loss anemia.                                                                                                Post partum procedures:D&C  Hospital course:  Pt was originally seen at another [facility] facility and transferred to [place] for further evaluation. Pt was found to be anemic with hemoglobin of 6 and active bleeding. She was taken to the OR where products of conception where removed and uterine massage was preformed with decreased bleeding. Post procedure pt's hemoglobin was stable, but she was severely symptomatic. She received 2 units PRBCs with improvement of her symptoms. She reports little bleeding today and is ready to be discharged home.   Physical exam  Vitals:   01/09/16 2321 01/10/16 0000 01/10/16 0152 01/10/16 0457  BP: (!) 105/54 111/62 (!) 100/46 (!) 104/58  Pulse: 100 93 90 97  Resp: 16 16 16 18   Temp: 99.1 F (37.3 C) 98.9 F (37.2 C) 97.9 F (36.6 C) 99 F (37.2 C)  TempSrc: Oral Oral Oral Oral  SpO2: 100% 100% 100% 100%  Weight:      Height:       General: alert, cooperative and no distress Lochia: appropriate Uterine Fundus: firm Incision: N/A DVT Evaluation: No evidence of DVT seen on physical exam. No significant calf/ankle edema. Labs: Lab Results  Component Value Date   WBC 16.9 (H) 01/10/2016   HGB 8.0 (L) 01/10/2016   HCT 22.3 (L) 01/10/2016   MCV 86.1 01/10/2016   PLT 206 01/10/2016   CMP Latest Ref Rng & Units 11/21/2010  Glucose 70 - 99 mg/dL 01(K)  BUN 6 - 23 mg/dL 4(L)  Creatinine 0.4 - 1.2 mg/dL <5.53 REPEATED TO VERIFY   Sodium 135 - 145 mEq/L 126(L)  Potassium 3.5 - 5.1 mEq/L 3.4(L)  Chloride 96 - 112 mEq/L 96  CO2 19 - 32 mEq/L 22  Calcium 8.4 - 10.5 mg/dL 8.8  Total Protein 6.0 - 8.3 g/dL 6.1  Total Bilirubin 0.3 - 1.2 mg/dL 7.4(M)  Alkaline Phos 39 - 117 U/L 36(L)  AST 0 - 37 U/L 18  ALT 0 - 35 U/L 14    Discharge instruction: per After Visit Summary and "Baby and Me Booklet".  After visit meds:    Medication List    STOP taking these medications   coconut oil Oil   oxyCODONE-acetaminophen 5-325 MG tablet Commonly known as:  ROXICET     TAKE these medications   ferrous sulfate 325 (65 FE) MG tablet Take 1 tablet (325 mg total) by mouth 2 (two) times daily with a meal.   ibuprofen 600 MG tablet Commonly known as:  ADVIL,MOTRIN Take 1 tablet (  600 mg total) by mouth every 6 (six) hours as needed (mild pain). What changed:  when to take this  reasons to take this   prenatal multivitamin Tabs tablet Take 1 tablet by mouth daily at 12 noon.   senna-docusate 8.6-50 MG tablet Commonly known as:  Senokot-S Take 2 tablets by mouth 2 (two) times daily.       Diet: routine diet  Activity: Advance as tolerated. Pelvic rest for 6 weeks.   Outpatient follow up:2 weeks Follow up Appt:Future Appointments Date Time Provider Department Center  01/11/2016 1:15 PM Roe Coombs, CNM [facility]     Disposition: Pt to be discharge home in stable condition.    01/10/2016 Ernestina Penna, MD

## 2016-01-11 ENCOUNTER — Ambulatory Visit: Payer: Medicaid Other | Admitting: Certified Nurse Midwife

## 2016-01-18 ENCOUNTER — Encounter (HOSPITAL_COMMUNITY): Payer: Self-pay | Admitting: Family Medicine

## 2016-01-24 ENCOUNTER — Encounter: Payer: Self-pay | Admitting: Certified Nurse Midwife

## 2016-01-24 ENCOUNTER — Ambulatory Visit (INDEPENDENT_AMBULATORY_CARE_PROVIDER_SITE_OTHER): Payer: Medicaid Other | Admitting: Certified Nurse Midwife

## 2016-01-24 VITALS — BP 117/76 | HR 102 | Temp 98.3°F | Ht 64.0 in | Wt 144.4 lb

## 2016-01-24 DIAGNOSIS — Z30013 Encounter for initial prescription of injectable contraceptive: Secondary | ICD-10-CM

## 2016-01-24 MED ORDER — MEDROXYPROGESTERONE ACETATE 150 MG/ML IM SUSP: 150.0000 mg | INTRAMUSCULAR | 0 refills | Status: DC

## 2016-01-24 NOTE — Progress Notes (Signed)
Subjective:     Stacey Shepard is a 25 y.o. female who presents for a postpartum visit. She is 3 weeks postpartum following a spontaneous vaginal delivery. I have fully reviewed the prenatal and intrapartum course. The delivery was at 37.6 gestational weeks. Outcome: spontaneous vaginal delivery. Anesthesia: epidural. Postpartum course has been complicated by retrained products of conception around 2 weeks postpartum. Baby's course has been normal. Baby is feeding by bottle - Similac Advance. Bleeding staining only. Bowel function is normal. Bladder function is normal. Patient is not sexually active. Contraception method is abstinence. Postpartum depression screening: negative.  Tobacco, alcohol and substance abuse history reviewed.  Adult immunizations reviewed including TDAP, rubella and varicella.  The following portions of the patient's history were reviewed and updated as appropriate: allergies, current medications, past family history, past medical history, past social history, past surgical history and problem list.  Review of Systems Pertinent items noted in HPI and remainder of comprehensive ROS otherwise negative.   Objective:    BP 117/76   Pulse (!) 102   Temp 98.3 F (36.8 C) (Oral)   Ht 5\' 4"  (1.626 m)   Wt 144 lb 6.4 oz (65.5 kg)   BMI 24.79 kg/m   General:  alert, cooperative and no distress   Breasts:  inspection negative, no nipple discharge or bleeding, no masses or nodularity palpable  Lungs: clear to auscultation bilaterally  Heart:  regular rate and rhythm, S1, S2 normal, no murmur, click, rub or gallop  Abdomen: soft, non-tender; bowel sounds normal; no masses,  no organomegaly          50% of 15 min visit spent on counseling and coordination of care.  Assessment:     Normal 3 week postpartum exam. Pap smear not done at today's visit.     Contraception management   H/O retained products of conception this postpartum period with hemorrhage  Plan:    1.  Contraception: abstinence and Depo-Provera injections, starting on shots 2.  Check CBC today d/t hx of hemorrhage 3. Follow up in: 3 weeks or as needed.  2hr GTT for h/o GDM/screening for DM q 3 yrs per ADA recommendations Preconception counseling provided Healthy lifestyle practices reviewed

## 2016-01-25 ENCOUNTER — Other Ambulatory Visit: Payer: Self-pay | Admitting: Certified Nurse Midwife

## 2016-01-25 LAB — CBC
HEMOGLOBIN: 9.1 g/dL — AB (ref 11.1–15.9)
Hematocrit: 28.1 % — ABNORMAL LOW (ref 34.0–46.6)
MCH: 28.9 pg (ref 26.6–33.0)
MCHC: 32.4 g/dL (ref 31.5–35.7)
MCV: 89 fL (ref 79–97)
Platelets: 495 10*3/uL — ABNORMAL HIGH (ref 150–379)
RBC: 3.15 x10E6/uL — AB (ref 3.77–5.28)
RDW: 14.9 % (ref 12.3–15.4)
WBC: 8.2 10*3/uL (ref 3.4–10.8)

## 2016-01-26 ENCOUNTER — Ambulatory Visit (INDEPENDENT_AMBULATORY_CARE_PROVIDER_SITE_OTHER): Payer: Medicaid Other | Admitting: Certified Nurse Midwife

## 2016-01-26 DIAGNOSIS — Z309 Encounter for contraceptive management, unspecified: Secondary | ICD-10-CM

## 2016-01-26 DIAGNOSIS — Z3042 Encounter for surveillance of injectable contraceptive: Secondary | ICD-10-CM | POA: Diagnosis not present

## 2016-01-26 LAB — POCT URINE PREGNANCY: PREG TEST UR: NEGATIVE

## 2016-01-26 MED ORDER — MEDROXYPROGESTERONE ACETATE 150 MG/ML IM SUSP
150.0000 mg | Freq: Once | INTRAMUSCULAR | Status: AC
  Administered 2016-01-26: 150 mg via INTRAMUSCULAR

## 2016-01-26 NOTE — Patient Instructions (Signed)
Patient advised to return to office on April 18, 2016 for next injection. She voiced understanding.

## 2016-01-26 NOTE — Progress Notes (Signed)
Nurse visit only

## 2016-02-14 ENCOUNTER — Ambulatory Visit: Payer: Medicaid Other | Admitting: Certified Nurse Midwife

## 2016-03-01 ENCOUNTER — Encounter: Payer: Self-pay | Admitting: *Deleted

## 2016-03-01 LAB — PROCEDURE REPORT - SCANNED: Pap: NEGATIVE

## 2016-04-16 ENCOUNTER — Other Ambulatory Visit: Payer: Self-pay | Admitting: Certified Nurse Midwife

## 2016-04-16 DIAGNOSIS — Z30013 Encounter for initial prescription of injectable contraceptive: Secondary | ICD-10-CM

## 2016-04-17 ENCOUNTER — Telehealth: Payer: Self-pay

## 2016-04-17 ENCOUNTER — Other Ambulatory Visit: Payer: Self-pay

## 2016-04-17 DIAGNOSIS — Z30013 Encounter for initial prescription of injectable contraceptive: Secondary | ICD-10-CM

## 2016-04-17 MED ORDER — MEDROXYPROGESTERONE ACETATE 150 MG/ML IM SUSP: 150.0000 mg | INTRAMUSCULAR | 3 refills | Status: DC

## 2016-04-17 NOTE — Progress Notes (Signed)
Depo Rx reordered not received by pharmacy.

## 2016-04-17 NOTE — Telephone Encounter (Signed)
Patient called in requesting refill on Depo, Pt. Up to date on office visits and called pt. And let her know that rx has been to her pharmacy and pt. Verbalized understanding.

## 2016-04-18 ENCOUNTER — Ambulatory Visit (INDEPENDENT_AMBULATORY_CARE_PROVIDER_SITE_OTHER): Payer: Medicaid Other | Admitting: *Deleted

## 2016-04-18 VITALS — BP 98/65 | HR 88 | Ht 64.0 in | Wt 137.0 lb

## 2016-04-18 DIAGNOSIS — Z3042 Encounter for surveillance of injectable contraceptive: Secondary | ICD-10-CM

## 2016-04-18 DIAGNOSIS — Z3483 Encounter for supervision of other normal pregnancy, third trimester: Secondary | ICD-10-CM

## 2016-04-18 MED ORDER — MEDROXYPROGESTERONE ACETATE 150 MG/ML IM SUSP
150.0000 mg | INTRAMUSCULAR | Status: AC
  Administered 2016-04-18 – 2017-03-11 (×3): 150 mg via INTRAMUSCULAR

## 2016-07-10 ENCOUNTER — Ambulatory Visit (INDEPENDENT_AMBULATORY_CARE_PROVIDER_SITE_OTHER): Payer: Medicaid Other | Admitting: *Deleted

## 2016-07-10 VITALS — BP 100/65 | HR 93

## 2016-07-10 DIAGNOSIS — Z30013 Encounter for initial prescription of injectable contraceptive: Secondary | ICD-10-CM

## 2016-07-10 DIAGNOSIS — Z3042 Encounter for surveillance of injectable contraceptive: Secondary | ICD-10-CM | POA: Diagnosis not present

## 2016-07-10 NOTE — Progress Notes (Signed)
Pt is in office for depo injection. Pt tolerated well.  Pt has no other concerns today. Pt advised to RTO 10/01/16 for next depo.  Administrations This Visit    medroxyPROGESTERone (DEPO-PROVERA) injection 150 mg    Admin Date 07/10/2016 Action Given Dose 150 mg Route Intramuscular Administered By Lanney GinsSuzanne D Lada Fulbright, CMA

## 2016-08-15 ENCOUNTER — Ambulatory Visit: Payer: Medicaid Other | Admitting: Obstetrics and Gynecology

## 2016-10-01 ENCOUNTER — Ambulatory Visit: Payer: Medicaid Other

## 2016-10-01 ENCOUNTER — Emergency Department (HOSPITAL_COMMUNITY)
Admission: EM | Admit: 2016-10-01 | Discharge: 2016-10-01 | Disposition: A | Discharge status: Home / Self Care | Payer: Medicaid Other | Attending: Emergency Medicine | Admitting: Emergency Medicine

## 2016-10-01 ENCOUNTER — Ambulatory Visit: Payer: Medicaid Other | Admitting: *Deleted

## 2016-10-01 ENCOUNTER — Encounter: Payer: Self-pay | Admitting: *Deleted

## 2016-10-01 ENCOUNTER — Encounter (HOSPITAL_COMMUNITY): Payer: Self-pay

## 2016-10-01 VITALS — BP 100/61 | HR 92 | Wt 129.4 lb

## 2016-10-01 DIAGNOSIS — Z79899 Other long term (current) drug therapy: Secondary | ICD-10-CM | POA: Diagnosis not present

## 2016-10-01 DIAGNOSIS — Y92410 Unspecified street and highway as the place of occurrence of the external cause: Secondary | ICD-10-CM | POA: Insufficient documentation

## 2016-10-01 DIAGNOSIS — Y999 Unspecified external cause status: Secondary | ICD-10-CM | POA: Diagnosis not present

## 2016-10-01 DIAGNOSIS — Y939 Activity, unspecified: Secondary | ICD-10-CM | POA: Diagnosis not present

## 2016-10-01 DIAGNOSIS — K130 Diseases of lips: Secondary | ICD-10-CM

## 2016-10-01 DIAGNOSIS — Z3042 Encounter for surveillance of injectable contraceptive: Secondary | ICD-10-CM

## 2016-10-01 DIAGNOSIS — M25551 Pain in right hip: Secondary | ICD-10-CM | POA: Insufficient documentation

## 2016-10-01 DIAGNOSIS — S79911A Unspecified injury of right hip, initial encounter: Secondary | ICD-10-CM | POA: Diagnosis present

## 2016-10-01 NOTE — Progress Notes (Signed)
Patient in office for Depo. She is too early for her pap- it is due 9/14. Depo due and given. Next shot due- 12/21/2016.

## 2016-10-01 NOTE — ED Notes (Signed)
Pt advises that her family members need to leave for work.  Apologized for the delay, PA notified of pt concern and she is working to get to her and her children
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
## 2016-12-17 ENCOUNTER — Ambulatory Visit: Payer: Medicaid Other

## 2016-12-24 ENCOUNTER — Ambulatory Visit (INDEPENDENT_AMBULATORY_CARE_PROVIDER_SITE_OTHER): Payer: Medicaid Other

## 2016-12-24 DIAGNOSIS — Z3042 Encounter for surveillance of injectable contraceptive: Secondary | ICD-10-CM | POA: Diagnosis not present

## 2016-12-24 MED ORDER — MEDROXYPROGESTERONE ACETATE 150 MG/ML IM SUSP
150.0000 mg | Freq: Once | INTRAMUSCULAR | Status: AC
  Administered 2016-12-24: 150 mg via INTRAMUSCULAR

## 2016-12-24 NOTE — Progress Notes (Signed)
Pt here for a depo. Injection given in right upper quadrant. Pt tolerated well. Pt advised to schedule appt for 3 months 03/17/17

## 2017-03-06 ENCOUNTER — Other Ambulatory Visit: Payer: Self-pay | Admitting: Certified Nurse Midwife

## 2017-03-06 DIAGNOSIS — Z30013 Encounter for initial prescription of injectable contraceptive: Secondary | ICD-10-CM

## 2017-03-11 ENCOUNTER — Ambulatory Visit (INDEPENDENT_AMBULATORY_CARE_PROVIDER_SITE_OTHER): Payer: Medicaid Other | Admitting: *Deleted

## 2017-03-11 VITALS — BP 96/61 | HR 97

## 2017-03-11 DIAGNOSIS — Z3042 Encounter for surveillance of injectable contraceptive: Secondary | ICD-10-CM | POA: Diagnosis not present

## 2017-03-11 NOTE — Progress Notes (Addendum)
Pt is in office for depo injection. Pt is on time for injection.  Pt supplied Depo for today's visit. Pt tolerated injection well. Pt advised to return to office on 06/02/17 for next injection.  BP 96/61   Pulse 97      Administrations This Visit    medroxyPROGESTERone (DEPO-PROVERA) injection 150 mg    Admin Date 03/11/2017 Action Given Dose 150 mg Route Intramuscular Administered By Lanney Gins, CMA

## 2017-06-03 ENCOUNTER — Ambulatory Visit: Payer: Medicaid Other

## 2017-06-05 ENCOUNTER — Ambulatory Visit (INDEPENDENT_AMBULATORY_CARE_PROVIDER_SITE_OTHER): Payer: Medicaid Other

## 2017-06-05 DIAGNOSIS — Z3042 Encounter for surveillance of injectable contraceptive: Secondary | ICD-10-CM

## 2017-06-05 MED ORDER — MEDROXYPROGESTERONE ACETATE 150 MG/ML IM SUSP
150.0000 mg | Freq: Once | INTRAMUSCULAR | Status: AC
  Administered 2017-06-05: 150 mg via INTRAMUSCULAR

## 2017-06-05 NOTE — Progress Notes (Signed)
Pt here for depo shot. Last given 9/24. Inj given in right upper outer quad. Pt tolerated well. Nex depo due 3/6-3/20.

## 2017-06-05 NOTE — Progress Notes (Signed)
Agree with nursing staff's documentation of this patient's clinic encounter.  Catalina AntiguaPeggy Jansen Goodpasture, MD 06/05/2017 11:16 AM

## 2017-08-26 ENCOUNTER — Encounter: Payer: Self-pay | Admitting: Obstetrics

## 2017-08-26 ENCOUNTER — Ambulatory Visit (INDEPENDENT_AMBULATORY_CARE_PROVIDER_SITE_OTHER): Payer: Medicaid Other | Admitting: Obstetrics

## 2017-08-26 ENCOUNTER — Other Ambulatory Visit (HOSPITAL_COMMUNITY)
Admission: RE | Admit: 2017-08-26 | Discharge: 2017-08-26 | Disposition: A | Discharge status: Home / Self Care | Payer: Medicaid Other | Source: Ambulatory Visit | Attending: Obstetrics | Admitting: Obstetrics

## 2017-08-26 ENCOUNTER — Ambulatory Visit: Payer: Medicaid Other

## 2017-08-26 VITALS — Ht 64.0 in | Wt 124.0 lb

## 2017-08-26 DIAGNOSIS — Z01419 Encounter for gynecological examination (general) (routine) without abnormal findings: Secondary | ICD-10-CM | POA: Insufficient documentation

## 2017-08-26 DIAGNOSIS — Z309 Encounter for contraceptive management, unspecified: Secondary | ICD-10-CM

## 2017-08-26 DIAGNOSIS — Z3042 Encounter for surveillance of injectable contraceptive: Secondary | ICD-10-CM

## 2017-08-26 MED ORDER — MEDROXYPROGESTERONE ACETATE 150 MG/ML IM SUSP
150.0000 mg | INTRAMUSCULAR | Status: DC
  Administered 2017-08-26 – 2018-10-28 (×3): 150 mg via INTRAMUSCULAR

## 2017-08-26 NOTE — Progress Notes (Signed)
Subjective:        Stacey Shepard is a 27 y.o. female here for a routine exam.  Current complaints: Vaginal discharge and irritation.    Personal health questionnaire:  Is patient Ashkenazi Jewish, have a family history of breast and/or ovarian cancer: no Is there a family history of uterine cancer diagnosed at age < 6450, gastrointestinal cancer, urinary tract cancer, family member who is a Personnel officerLynch syndrome-associated carrier: no Is the patient overweight and hypertensive, family history of diabetes, personal history of gestational diabetes, preeclampsia or PCOS: no Is patient over 5555, have PCOS,  family history of premature CHD under age 28, diabetes, smoke, have hypertension or peripheral artery disease:  no At any time, has a partner hit, kicked or otherwise hurt or frightened you?: no Over the past 2 weeks, have you felt down, depressed or hopeless?: no Over the past 2 weeks, have you felt little interest or pleasure in doing things?:no   Gynecologic History No LMP recorded. Patient has had an injection. Contraception: Depo-Provera injections Last Pap: 2017. Results were: normal Last mammogram: n/a. Results were: n/a  Obstetric History OB History  Gravida Para Term Preterm AB Living  3 3 3  0 0 3  SAB TAB Ectopic Multiple Live Births  0 0 0 0 3    # Outcome Date GA Lbr Len/2nd Weight Sex Delivery Anes PTL Lv  3 Term 12/28/15 2843w0d 17:37 / 00:05 7 lb 3.9 oz (3.286 kg) F Vag-Spont None  LIV  2 Term 05/26/12 3369w0d 01:25 / 00:10 7 lb 4.8 oz (3.31 kg) F Vag-Spont EPI  LIV  1 Term 05/14/11 4154w4d 03:43 / 00:43 6 lb 12.1 oz (3.065 kg) F Vag-Spont EPI  LIV      Past Medical History:  Diagnosis Date  . Hx of chlamydia infection   . Medical history non-contributory     Past Surgical History:  Procedure Laterality Date  . DILATION AND CURETTAGE OF UTERUS N/A 01/09/2016   Procedure: DILATATION AND CURETTAGE;  Surgeon: Reva Boresanya S Pratt, MD;  Location: [facility];  Service: Gynecology;   Laterality: N/A;  . NO PAST SURGERIES       Current Outpatient Medications:  .  ferrous sulfate 325 (65 FE) MG tablet, Take 1 tablet (325 mg total) by mouth 2 (two) times daily with a meal., Disp: 60 tablet, Rfl: 3 .  ibuprofen (ADVIL,MOTRIN) 600 MG tablet, Take 1 tablet (600 mg total) by mouth every 6 (six) hours as needed (mild pain)., Disp: 30 tablet, Rfl: 0 .  medroxyPROGESTERone (DEPO-PROVERA) 150 MG/ML injection, use as directed at physician's office every 3 months, Disp: 1 mL, Rfl: 3 .  Prenatal Vit-Fe Fumarate-FA (PRENATAL MULTIVITAMIN) TABS tablet, Take 1 tablet by mouth daily at 12 noon., Disp: , Rfl:  .  senna-docusate (SENOKOT-S) 8.6-50 MG tablet, Take 2 tablets by mouth 2 (two) times daily. (Patient not taking: Reported on 04/18/2016), Disp: 90 tablet, Rfl: PRN  Current Facility-Administered Medications:  .  medroxyPROGESTERone (DEPO-PROVERA) injection 150 mg, 150 mg, Intramuscular, Q90 days, Brock BadHarper, Kaliel Bolds A, MD, 150 mg at 08/26/17 1050 No Known Allergies  Social History   Tobacco Use  . Smoking status: Never Smoker  . Smokeless tobacco: Never Used  Substance Use Topics  . Alcohol use: No    Alcohol/week: 0.0 oz    Family History  Problem Relation Age of Onset  . Cancer Paternal Aunt        stomach  . Breast cancer Paternal Aunt  Review of Systems  Constitutional: negative for fatigue and weight loss Respiratory: negative for cough and wheezing Cardiovascular: negative for chest pain, fatigue and palpitations Gastrointestinal: negative for abdominal pain and change in bowel habits Musculoskeletal:negative for myalgias Neurological: negative for gait problems and tremors Behavioral/Psych: negative for abusive relationship, depression Endocrine: negative for temperature intolerance    Genitourinary:negative for abnormal menstrual periods, genital lesions, hot flashes, sexual problems and vaginal discharge Integument/breast: negative for breast lump, breast  tenderness, nipple discharge and skin lesion(s)    Objective:       Ht 5\' 4"  (1.626 m)   Wt 124 lb (56.2 kg)   BMI 21.28 kg/m  General:   alert  Skin:   no rash or abnormalities  Lungs:   clear to auscultation bilaterally  Heart:   regular rate and rhythm, S1, S2 normal, no murmur, click, rub or gallop  Breasts:   normal without suspicious masses, skin or nipple changes or axillary nodes  Abdomen:  normal findings: no organomegaly, soft, non-tender and no hernia  Pelvis:  External genitalia: normal general appearance Urinary system: urethral meatus normal and bladder without fullness, nontender Vaginal: normal without tenderness, induration or masses Cervix: normal appearance Adnexa: normal bimanual exam Uterus: anteverted and non-tender, normal size   Lab Review Urine pregnancy test Labs reviewed yes Radiologic studies reviewed no  50% of 20 min visit spent on counseling and coordination of care.   Assessment:     1. Encounter for routine gynecological examination with Papanicolaou smear of cervix rX: - Cytology - PAP - Cervicovaginal ancillary only  2. Encounter for surveillance of injectable contraceptive - pleased with Depo Provera   Plan:    Education reviewed: calcium supplements, depression evaluation, low fat, low cholesterol diet, safe sex/STD prevention, self breast exams and weight bearing exercise. Contraception: Depo-Provera injections. Follow up in: 1 year.   Meds ordered this encounter  Medications  . medroxyPROGESTERone (DEPO-PROVERA) injection 150 mg   No orders of the defined types were placed in this encounter.   Brock Bad MD

## 2017-08-26 NOTE — Progress Notes (Signed)
Pt is in the office for annual, complains of a little vaginal irritation. Administered depo and pt tolerated well .Marland Kitchen. Administrations This Visit    medroxyPROGESTERone (DEPO-PROVERA) injection 150 mg    Admin Date 08/26/2017 Action Given Dose 150 mg Route Intramuscular Administered By Katrina StackStalling, Ashantee Deupree D, RN

## 2017-08-27 LAB — CERVICOVAGINAL ANCILLARY ONLY
Bacterial vaginitis: NEGATIVE
CHLAMYDIA, DNA PROBE: NEGATIVE
Candida vaginitis: POSITIVE — AB
NEISSERIA GONORRHEA: NEGATIVE
Trichomonas: NEGATIVE

## 2017-08-28 ENCOUNTER — Other Ambulatory Visit: Payer: Self-pay | Admitting: Obstetrics

## 2017-08-28 DIAGNOSIS — B3731 Acute candidiasis of vulva and vagina: Secondary | ICD-10-CM

## 2017-08-28 DIAGNOSIS — B373 Candidiasis of vulva and vagina: Secondary | ICD-10-CM

## 2017-08-28 LAB — CYTOLOGY - PAP
Diagnosis: NEGATIVE
HPV: NOT DETECTED

## 2017-08-28 MED ORDER — FLUCONAZOLE 150 MG PO TABS: 150.0000 mg | ORAL_TABLET | Freq: Once | ORAL | 0 refills | Status: AC

## 2017-08-29 ENCOUNTER — Other Ambulatory Visit: Payer: Self-pay | Admitting: Obstetrics

## 2017-08-29 DIAGNOSIS — B3731 Acute candidiasis of vulva and vagina: Secondary | ICD-10-CM

## 2017-08-29 DIAGNOSIS — B373 Candidiasis of vulva and vagina: Secondary | ICD-10-CM

## 2017-08-29 MED ORDER — FLUCONAZOLE 150 MG PO TABS
150.0000 mg | ORAL_TABLET | Freq: Once | ORAL | 0 refills | Status: AC
Start: 2017-08-29 — End: 2017-08-29

## 2017-11-18 ENCOUNTER — Ambulatory Visit (INDEPENDENT_AMBULATORY_CARE_PROVIDER_SITE_OTHER): Payer: Medicaid Other

## 2017-11-18 DIAGNOSIS — Z3042 Encounter for surveillance of injectable contraceptive: Secondary | ICD-10-CM

## 2017-11-18 MED ORDER — MEDROXYPROGESTERONE ACETATE 150 MG/ML IM SUSP
150.0000 mg | Freq: Once | INTRAMUSCULAR | Status: AC
  Administered 2017-11-18: 150 mg via INTRAMUSCULAR

## 2017-11-18 NOTE — Progress Notes (Signed)
Pt here for depo shot. Last inj given 08/26/17. Pt is within her window. Inj given in RUOQ . Pt tolerated well.

## 2017-11-19 NOTE — Progress Notes (Signed)
I have reviewed the chart and agree with nursing staff's documentation of this patient's encounter.  Ahniyah Giancola, MD 11/19/2017 8:16 AM    

## 2018-02-10 ENCOUNTER — Ambulatory Visit (INDEPENDENT_AMBULATORY_CARE_PROVIDER_SITE_OTHER): Payer: Medicaid Other

## 2018-02-10 DIAGNOSIS — Z3042 Encounter for surveillance of injectable contraceptive: Secondary | ICD-10-CM

## 2018-02-10 NOTE — Progress Notes (Addendum)
Nurse visit for Depo. Pt is within window. Depo given RUOQ w/o difficulty. Next Depo due Nov 11-25, pt agrees.

## 2018-02-24 ENCOUNTER — Ambulatory Visit (INDEPENDENT_AMBULATORY_CARE_PROVIDER_SITE_OTHER): Payer: Medicaid Other | Admitting: Obstetrics

## 2018-02-24 ENCOUNTER — Encounter: Payer: Self-pay | Admitting: Obstetrics

## 2018-02-24 VITALS — BP 123/79 | HR 110 | Ht 64.0 in | Wt 123.0 lb

## 2018-02-24 DIAGNOSIS — Z3042 Encounter for surveillance of injectable contraceptive: Secondary | ICD-10-CM

## 2018-02-24 DIAGNOSIS — N939 Abnormal uterine and vaginal bleeding, unspecified: Secondary | ICD-10-CM

## 2018-02-24 NOTE — Progress Notes (Signed)
Presents for FU. However, she stop bleeding with last DEPO, has no complaints today.

## 2018-02-24 NOTE — Progress Notes (Signed)
Patient ID: Stacey Shepard, female   DOB: 1990-07-22, 27 y.o.   MRN: [id number]  Chief Complaint  Patient presents with  . Follow-up    HPI Stacey Shepard is a 27 y.o. female.  Had abnormal vaginal bleeding last month, but bleeding has stopped.  She has been on Depo Provera Injections for 2 years, no problems until last month. HPI  Past Medical History:  Diagnosis Date  . Hx of chlamydia infection   . Medical history non-contributory     Past Surgical History:  Procedure Laterality Date  . DILATION AND CURETTAGE OF UTERUS N/A 01/09/2016   Procedure: DILATATION AND CURETTAGE;  Surgeon: Reva Bores, MD;  Location: [facility];  Service: Gynecology;  Laterality: N/A;  . NO PAST SURGERIES      Family History  Problem Relation Age of Onset  . Cancer Paternal Aunt        stomach  . Breast cancer Paternal Aunt     Social History Social History   Tobacco Use  . Smoking status: Never Smoker  . Smokeless tobacco: Never Used  Substance Use Topics  . Alcohol use: No    Alcohol/week: 0.0 standard drinks  . Drug use: No    No Known Allergies  Current Outpatient Medications  Medication Sig Dispense Refill  . ferrous sulfate 325 (65 FE) MG tablet Take 1 tablet (325 mg total) by mouth 2 (two) times daily with a meal. 60 tablet 3  . ibuprofen (ADVIL,MOTRIN) 600 MG tablet Take 1 tablet (600 mg total) by mouth every 6 (six) hours as needed (mild pain). 30 tablet 0  . medroxyPROGESTERone (DEPO-PROVERA) 150 MG/ML injection use as directed at physician's office every 3 months 1 mL 3  . Prenatal Vit-Fe Fumarate-FA (PRENATAL MULTIVITAMIN) TABS tablet Take 1 tablet by mouth daily at 12 noon.    . senna-docusate (SENOKOT-S) 8.6-50 MG tablet Take 2 tablets by mouth 2 (two) times daily. (Patient not taking: Reported on 04/18/2016) 90 tablet PRN   Current Facility-Administered Medications  Medication Dose Route Frequency Provider Last Rate Last Dose  . medroxyPROGESTERone (DEPO-PROVERA)  injection 150 mg  150 mg Intramuscular Q90 days Brock Bad, MD   150 mg at 02/10/18 1059    Review of Systems Review of Systems Constitutional: negative for fatigue and weight loss Respiratory: negative for cough and wheezing Cardiovascular: negative for chest pain, fatigue and palpitations Gastrointestinal: negative for abdominal pain and change in bowel habits Genitourinary: POSITIVE for AUB Integument/breast: negative for nipple discharge Musculoskeletal:negative for myalgias Neurological: negative for gait problems and tremors Behavioral/Psych: negative for abusive relationship, depression Endocrine: negative for temperature intolerance      Blood pressure 123/79, pulse (!) 110, height 5\' 4"  (1.626 m), weight 123 lb (55.8 kg).  Physical Exam Physical Exam:  Deferred  50% of 15 min visit spent on counseling and coordination of care.   Data Reviewed Cultures and Wet Prep  Assessment   1. Depo-Provera contraceptive status  2. Abnormal uterine bleeding (AUB) - GC / CH cultures   Plan    Follow up prn   No orders of the defined types were placed in this encounter.  No orders of the defined types were placed in this encounter.   Brock Bad MD 02-24-2018

## 2018-04-23 ENCOUNTER — Other Ambulatory Visit: Payer: Self-pay

## 2018-04-23 DIAGNOSIS — Z30013 Encounter for initial prescription of injectable contraceptive: Secondary | ICD-10-CM

## 2018-04-23 DIAGNOSIS — Z3042 Encounter for surveillance of injectable contraceptive: Secondary | ICD-10-CM

## 2018-04-23 MED ORDER — MEDROXYPROGESTERONE ACETATE 150 MG/ML IM SUSP: INTRAMUSCULAR | 3 refills | Status: DC

## 2018-04-23 NOTE — Progress Notes (Signed)
Rx sent pt on schedule for Depo and had AEX exam 08/2017

## 2018-04-29 ENCOUNTER — Ambulatory Visit: Payer: Medicaid Other

## 2018-05-08 ENCOUNTER — Ambulatory Visit: Payer: Medicaid Other

## 2018-05-09 ENCOUNTER — Ambulatory Visit (INDEPENDENT_AMBULATORY_CARE_PROVIDER_SITE_OTHER): Payer: Medicaid Other

## 2018-05-09 VITALS — BP 108/68 | HR 102 | Resp 20 | Ht 64.0 in | Wt 127.4 lb

## 2018-05-09 DIAGNOSIS — Z3042 Encounter for surveillance of injectable contraceptive: Secondary | ICD-10-CM | POA: Diagnosis not present

## 2018-05-09 MED ORDER — MEDROXYPROGESTERONE ACETATE 150 MG/ML IM SUSP
150.0000 mg | Freq: Once | INTRAMUSCULAR | Status: AC
  Administered 2018-05-09: 150 mg via INTRAMUSCULAR

## 2018-05-09 NOTE — Progress Notes (Signed)
After obtaining consent, and per orders of Dr. Coral Ceoharles Harper, injection of Depo Provera was administered by Riley NearingMorehead, [facility] Kyler Byrd. Patient reports no complaints from previous injection. Last injection - 02/10/18. Window: 11/11 - 11/25. Patient is within window. Administered injection - LUOQ with no complaints. Patient was advised to schedule next injection between 07/25/18 - 08/08/18. Patient stated she understood.Patient instructed to remain in clinic for 20 minutes afterwards, and to report any adverse reaction to me immediately.

## 2018-06-13 ENCOUNTER — Ambulatory Visit: Payer: Medicaid Other

## 2018-07-09 IMAGING — US US PELVIS COMPLETE
1 series · 15 of 25 positions shown · non-contrast
Comparison: 11/21/2010.

CLINICAL DATA: Postpartum bleeding.

EXAM:
TRANSABDOMINAL AND TRANSVAGINAL ULTRASOUND OF PELVIS
TECHNIQUE: Both transabdominal and transvaginal ultrasound examinations of the
pelvis were performed. Transabdominal technique was performed for
global imaging of the pelvis including uterus, ovaries, adnexal
regions, and pelvic cul-de-sac. It was necessary to proceed with
endovaginal exam following the transabdominal exam to visualize the
uterus and ovaries.

[Series 1: us pelvis complete · 41 acquisitions, 15 frames shown]
[im 1/41]
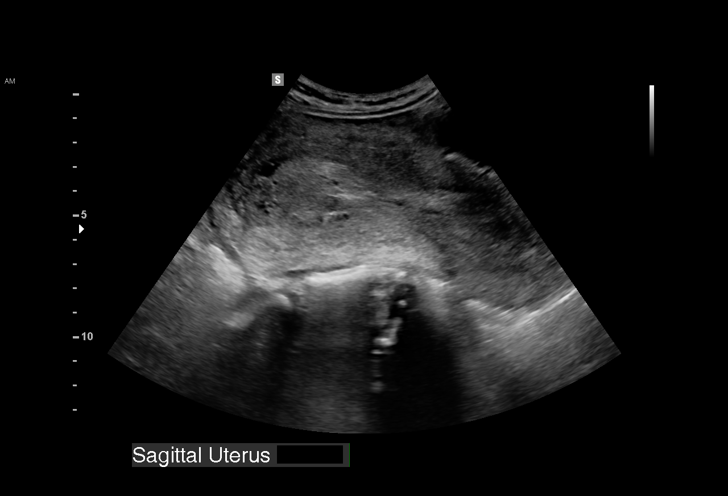
[im 4/41]
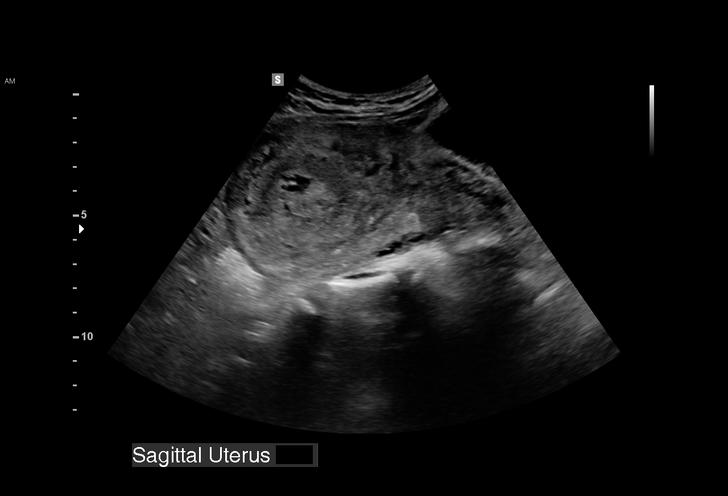
[im 7/41]
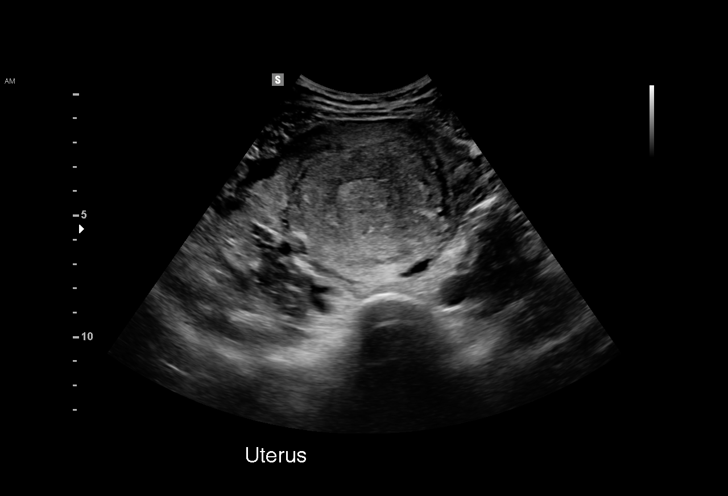
[im 9/41]
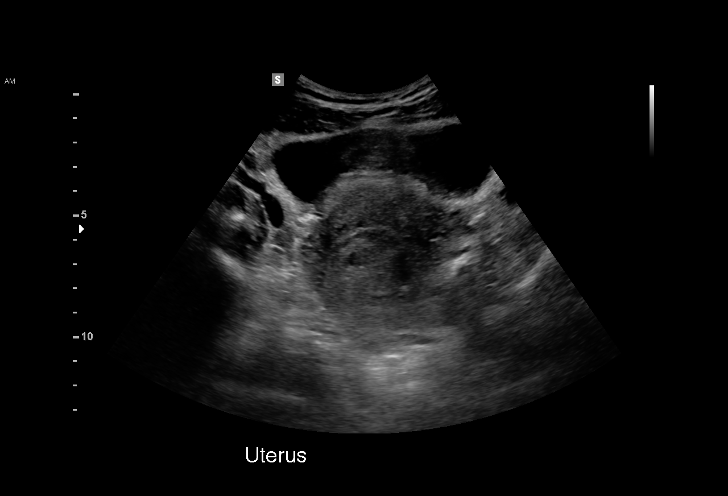
[im 12/41]
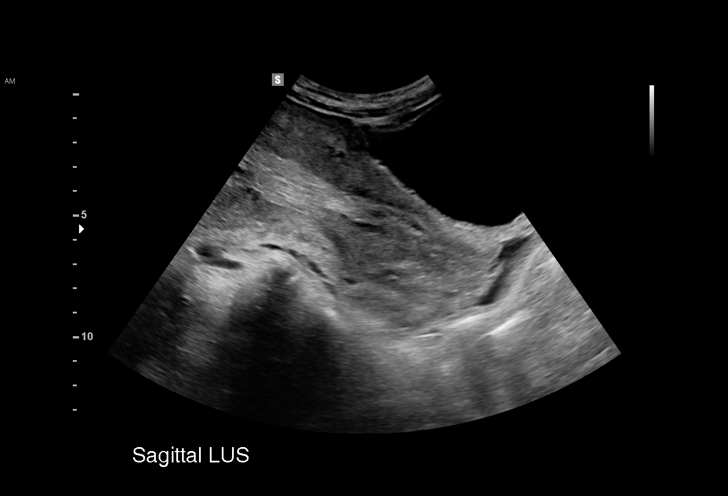
[im 16/41]
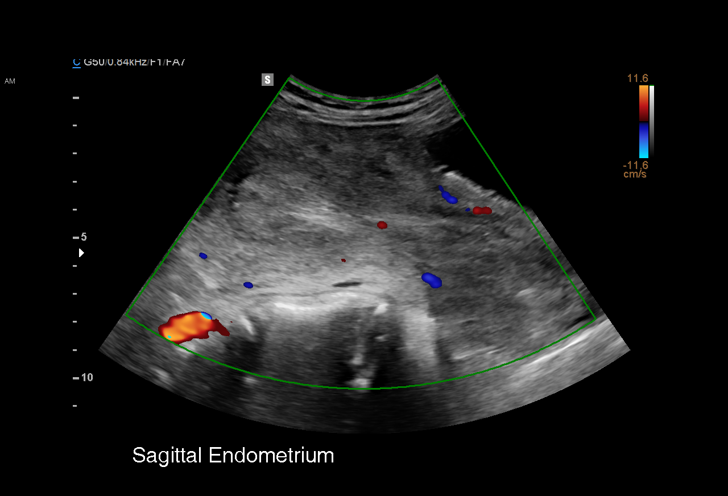
[im 17/41]
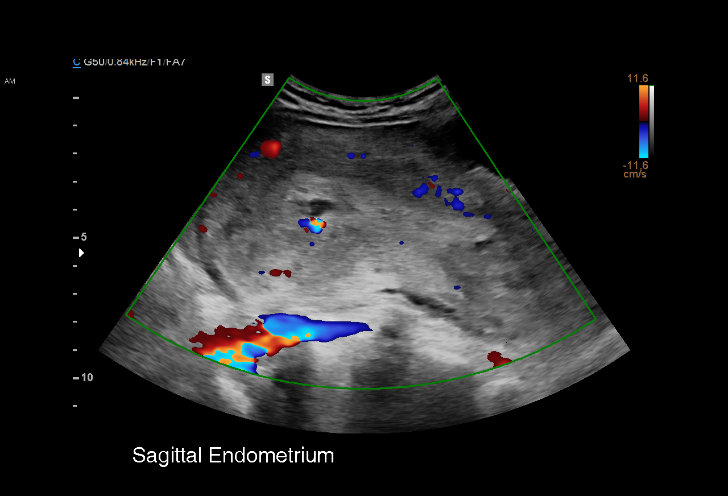
[im 21/41]
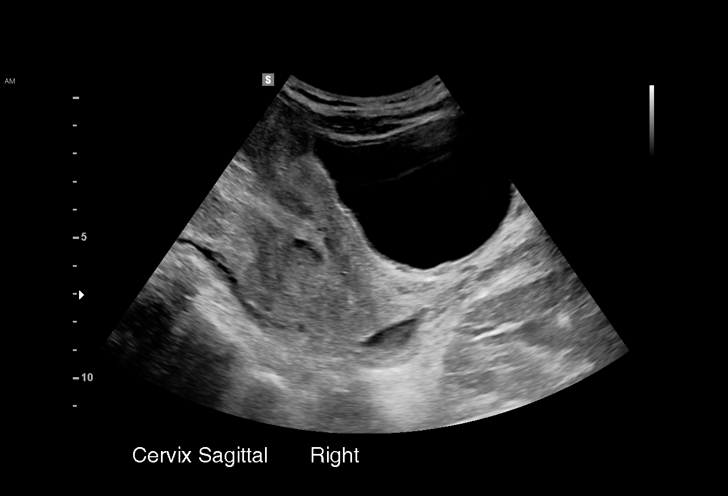
[im 24/41]
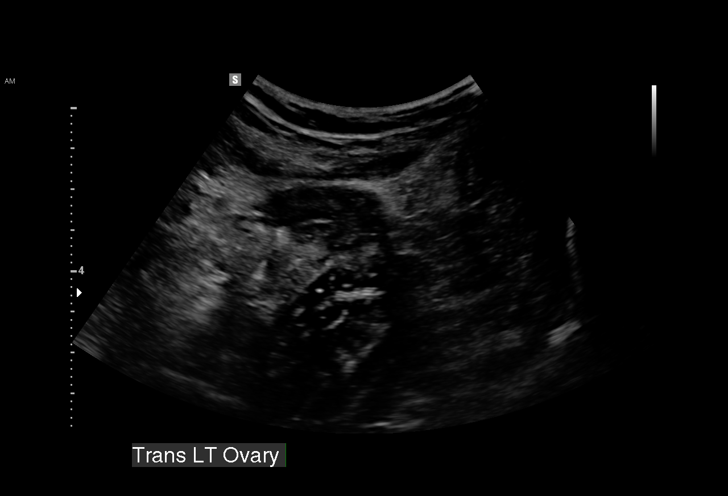
[im 26/41]
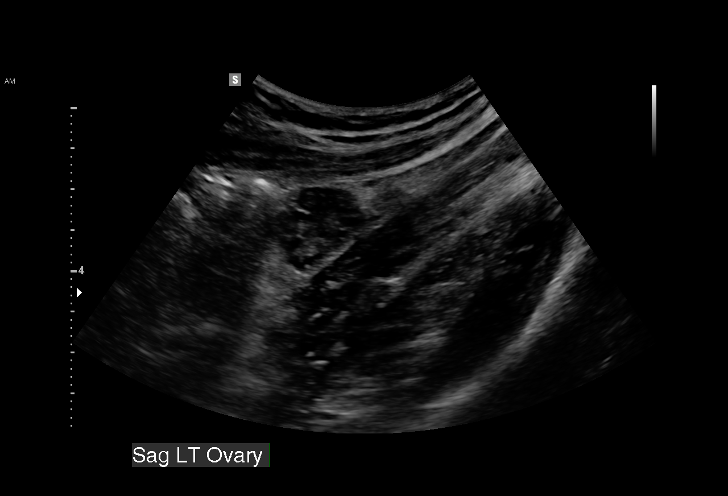
[im 29/41]
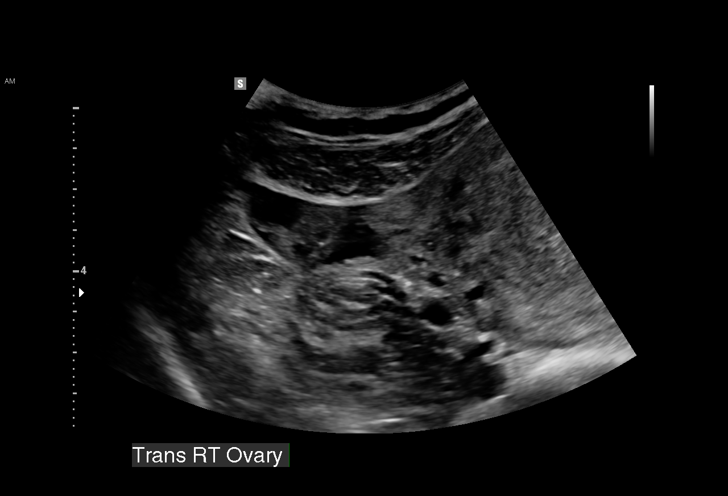
[im 32/41]
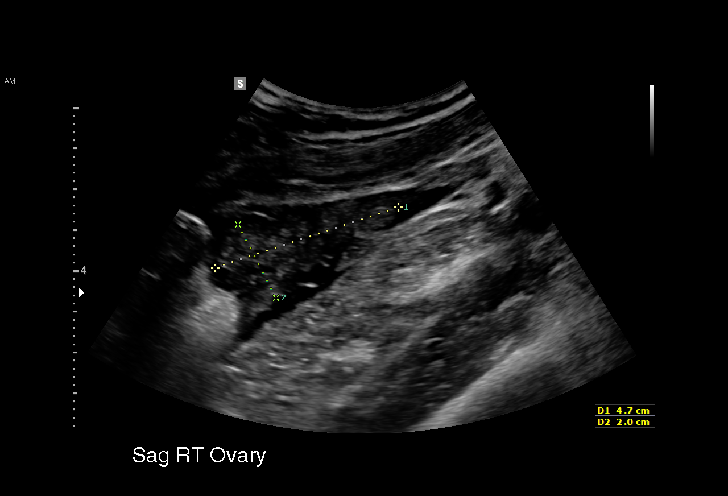
[im 34/41]
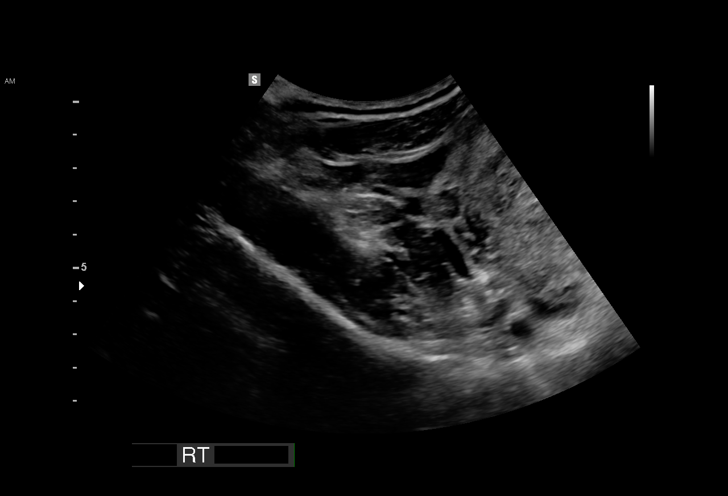
[im 37/41]
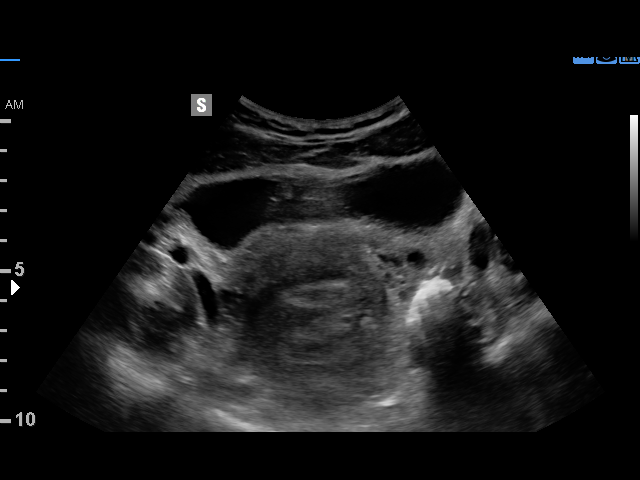
[im 41/41]
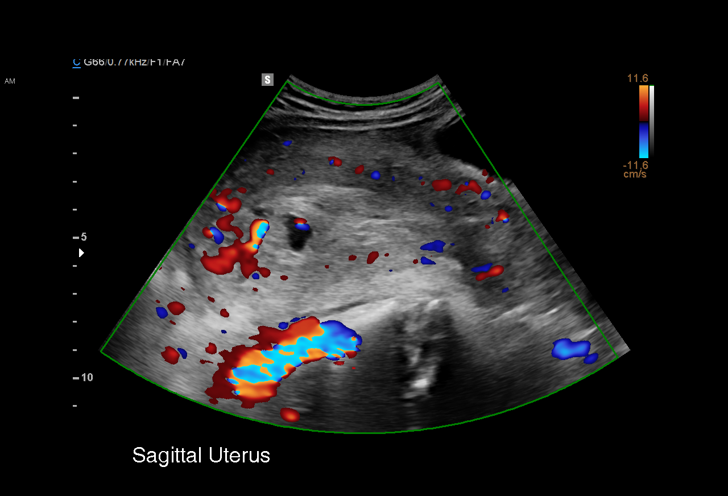

[15 of 25 positions shown; findings below may reference images not displayed]

FINDINGS: Uterus

Measurements: 16.9 x 6.4 by 6.6 cm. No fibroids or other mass
visualized.

Endometrium

Thickness: 30.0. Thickened inter endometrium. Retained products of
conception cannot be excluded .

Right ovary

Measurements: 4.7 x 2.0 x 2.5 cm. Normal appearance/no adnexal mass.

Left ovary

Measurements: 2.1 x 2.3 x 3.6 cm. Normal appearance/no adnexal mass.

Other findings

Trace free pelvic fluid.
IMPRESSION: Thickened heterogeneous endometrium. Retained products of conception
cannot be excluded. Trace free pelvic fluid.

## 2018-07-31 ENCOUNTER — Ambulatory Visit: Payer: Medicaid Other

## 2018-08-07 ENCOUNTER — Ambulatory Visit (INDEPENDENT_AMBULATORY_CARE_PROVIDER_SITE_OTHER): Payer: Medicaid Other

## 2018-08-07 DIAGNOSIS — Z3042 Encounter for surveillance of injectable contraceptive: Secondary | ICD-10-CM | POA: Diagnosis not present

## 2018-08-07 MED ORDER — MEDROXYPROGESTERONE ACETATE 150 MG/ML IM SUSP
150.0000 mg | Freq: Once | INTRAMUSCULAR | Status: AC
  Administered 2018-08-07: 150 mg via INTRAMUSCULAR

## 2018-08-07 NOTE — Progress Notes (Signed)
Pt is here for depo injection, she is on time for injection. Injection given, pt tolerated well. Next Depo injection due 5/8-5/22.

## 2018-09-02 ENCOUNTER — Encounter: Payer: Self-pay | Admitting: Obstetrics and Gynecology

## 2018-09-02 ENCOUNTER — Other Ambulatory Visit (HOSPITAL_COMMUNITY)
Admission: RE | Admit: 2018-09-02 | Discharge: 2018-09-02 | Disposition: A | Discharge status: Home / Self Care | Payer: Medicaid Other | Source: Ambulatory Visit | Attending: Obstetrics | Admitting: Obstetrics

## 2018-09-02 ENCOUNTER — Other Ambulatory Visit: Payer: Self-pay

## 2018-09-02 ENCOUNTER — Ambulatory Visit: Payer: Medicaid Other | Admitting: Obstetrics and Gynecology

## 2018-09-02 VITALS — BP 110/73 | HR 83 | Ht 64.0 in | Wt 125.0 lb

## 2018-09-02 DIAGNOSIS — Z0000 Encounter for general adult medical examination without abnormal findings: Secondary | ICD-10-CM | POA: Diagnosis not present

## 2018-09-02 DIAGNOSIS — Z202 Contact with and (suspected) exposure to infections with a predominantly sexual mode of transmission: Secondary | ICD-10-CM | POA: Diagnosis not present

## 2018-09-02 DIAGNOSIS — Z3042 Encounter for surveillance of injectable contraceptive: Secondary | ICD-10-CM

## 2018-09-02 DIAGNOSIS — Z01419 Encounter for gynecological examination (general) (routine) without abnormal findings: Secondary | ICD-10-CM | POA: Insufficient documentation

## 2018-09-02 MED ORDER — MEDROXYPROGESTERONE ACETATE 150 MG/ML IM SUSP: INTRAMUSCULAR | 3 refills | Status: DC

## 2018-09-02 NOTE — Patient Instructions (Signed)

## 2018-09-02 NOTE — Progress Notes (Signed)
Stacey Shepard is a 28 y.o. [id number] female here for a routine annual gynecologic exam.  She reports some BTB at times with the Depo Provera. She also request STD testing. She o/w denies any GYN concerns.   Gynecologic History No LMP recorded. Patient has had an injection. Contraception: Depo-Provera injections Last Pap: 2019. Results were: normal Last mammogram: NA. Results were: NA  Obstetric History OB History  Gravida Para Term Preterm AB Living  3 3 3  0 0 3  SAB TAB Ectopic Multiple Live Births  0 0 0 0 3    # Outcome Date GA Lbr Len/2nd Weight Sex Delivery Anes PTL Lv  3 Term 12/28/15 [redacted]w[redacted]d 17:37 / 00:05 7 lb 3.9 oz (3.286 kg) F Vag-Spont None  LIV  2 Term 05/26/12 [redacted]w[redacted]d 01:25 / 00:10 7 lb 4.8 oz (3.31 kg) F Vag-Spont EPI  LIV  1 Term 05/14/11 [redacted]w[redacted]d 03:43 / 00:43 6 lb 12.1 oz (3.065 kg) F Vag-Spont EPI  LIV    Past Medical History:  Diagnosis Date  . Hx of chlamydia infection   . Medical history non-contributory     Past Surgical History:  Procedure Laterality Date  . DILATION AND CURETTAGE OF UTERUS N/A 01/09/2016   Procedure: DILATATION AND CURETTAGE;  Surgeon: Reva Bores, MD;  Location: [facility];  Service: Gynecology;  Laterality: N/A;  . NO PAST SURGERIES      Current Outpatient Medications on File Prior to Visit  Medication Sig Dispense Refill  . ferrous sulfate 325 (65 FE) MG tablet Take 1 tablet (325 mg total) by mouth 2 (two) times daily with a meal. 60 tablet 3  . medroxyPROGESTERone (DEPO-PROVERA) 150 MG/ML injection use as directed at physician's office every 3 months 1 mL 3  . ibuprofen (ADVIL,MOTRIN) 600 MG tablet Take 1 tablet (600 mg total) by mouth every 6 (six) hours as needed (mild pain). 30 tablet 0  . Prenatal Vit-Fe Fumarate-FA (PRENATAL MULTIVITAMIN) TABS tablet Take 1 tablet by mouth daily at 12 noon.    . senna-docusate (SENOKOT-S) 8.6-50 MG tablet Take 2 tablets by mouth 2 (two) times daily. 90 tablet PRN   Current Facility-Administered  Medications on File Prior to Visit  Medication Dose Route Frequency Provider Last Rate Last Dose  . medroxyPROGESTERone (DEPO-PROVERA) injection 150 mg  150 mg Intramuscular Q90 days Brock Bad, MD   150 mg at 02/10/18 1059    No Known Allergies  Social History   Socioeconomic History  . Marital status: Single    Spouse name: Not on file  . Number of children: Not on file  . Years of education: Not on file  . Highest education level: Not on file  Occupational History  . Not on file  Social Needs  . Financial resource strain: Not on file  . Food insecurity:    Worry: Not on file    Inability: Not on file  . Transportation needs:    Medical: Not on file    Non-medical: Not on file  Tobacco Use  . Smoking status: Never Smoker  . Smokeless tobacco: Never Used  Substance and Sexual Activity  . Alcohol use: No    Alcohol/week: 0.0 standard drinks  . Drug use: No  . Sexual activity: Yes    Partners: Male    Birth control/protection: Injection  Lifestyle  . Physical activity:    Days per week: Not on file    Minutes per session: Not on file  . Stress: Not on file  Relationships  . Social connections:    Talks on phone: Not on file    Gets together: Not on file    Attends religious service: Not on file    Active member of club or organization: Not on file    Attends meetings of clubs or organizations: Not on file    Relationship status: Not on file  . Intimate partner violence:    Fear of current or ex partner: Not on file    Emotionally abused: Not on file    Physically abused: Not on file    Forced sexual activity: Not on file  Other Topics Concern  . Not on file  Social History Narrative  . Not on file    Family History  Problem Relation Age of Onset  . Cancer Paternal Aunt        stomach  . Breast cancer Paternal Aunt     The following portions of the patient's history were reviewed and updated as appropriate: allergies, current medications, past  family history, past medical history, past social history, past surgical history and problem list.  Review of Systems Pertinent items noted in HPI and remainder of comprehensive ROS otherwise negative.   Objective:  BP 110/73   Pulse 83   Ht 5\' 4"  (1.626 m)   Wt 125 lb (56.7 kg)   BMI 21.46 kg/m  CONSTITUTIONAL: Well-developed, well-nourished female in no acute distress.  HENT:  Normocephalic, atraumatic, External right and left ear normal. Oropharynx is clear and moist EYES: Conjunctivae and EOM are normal. Pupils are equal, round, and reactive to light. No scleral icterus.  NECK: Normal range of motion, supple, no masses.  Normal thyroid.  SKIN: Skin is warm and dry. No rash noted. Not diaphoretic. No erythema. No pallor. NEUROLGIC: Alert and oriented to person, place, and time. Normal reflexes, muscle tone coordination. No cranial nerve deficit noted. PSYCHIATRIC: Normal mood and affect. Normal behavior. Normal judgment and thought content. CARDIOVASCULAR: Normal heart rate noted, regular rhythm RESPIRATORY: Clear to auscultation bilaterally. Effort and breath sounds normal, no problems with respiration noted. BREASTS: Symmetric in size. No masses, skin changes, nipple drainage, or lymphadenopathy. ABDOMEN: Soft, normal bowel sounds, no distention noted.  No tenderness, rebound or guarding.  PELVIC: Normal appearing external genitalia; normal appearing vaginal mucosa and cervix.  No abnormal discharge noted.  Pap smear obtained.  Normal uterine size, no other palpable masses, no uterine or adnexal tenderness. MUSCULOSKELETAL: Normal range of motion. No tenderness.  No cyanosis, clubbing, or edema.  2+ distal pulses.   Assessment:  Annual gynecologic examination  Depo Provera contraception STD testing   Plan:  Will follow up as per STD testing. Discussed management options for BTB with Depo Provera. Pt declines presently. Instructed to call back if she changes her mind. Routine  preventative health maintenance measures emphasized. Please refer to After Visit Summary for other counseling recommendations.    Hermina Staggers, MD, FACOG Attending Obstetrician & Gynecologist Center for [facility], [facility]

## 2018-09-03 LAB — CERVICOVAGINAL ANCILLARY ONLY
Chlamydia: NEGATIVE
NEISSERIA GONORRHEA: NEGATIVE

## 2018-09-03 LAB — RPR: RPR Ser Ql: NONREACTIVE

## 2018-09-03 LAB — HEPATITIS B SURFACE ANTIGEN: Hepatitis B Surface Ag: NEGATIVE

## 2018-09-03 LAB — HIV ANTIBODY (ROUTINE TESTING W REFLEX): HIV Screen 4th Generation wRfx: NONREACTIVE

## 2018-09-03 LAB — HEPATITIS C ANTIBODY: Hep C Virus Ab: <0.1 s/co ratio (ref 0.0–0.9)

## 2018-10-28 ENCOUNTER — Ambulatory Visit (INDEPENDENT_AMBULATORY_CARE_PROVIDER_SITE_OTHER): Payer: Medicaid Other

## 2018-10-28 ENCOUNTER — Other Ambulatory Visit: Payer: Self-pay

## 2018-10-28 DIAGNOSIS — Z3042 Encounter for surveillance of injectable contraceptive: Secondary | ICD-10-CM | POA: Diagnosis not present

## 2018-10-28 MED ORDER — MEDROXYPROGESTERONE ACETATE 150 MG/ML IM SUSP: 150.0000 mg | INTRAMUSCULAR | 0 refills | Status: DC

## 2018-10-28 NOTE — Progress Notes (Signed)
Pt is in the office for depo injection, administered in RUOQ and pt tolerated well. Next due 7/28- 8/11 .Marland Kitchen Administrations This Visit    medroxyPROGESTERone (DEPO-PROVERA) injection 150 mg    Admin Date 10/28/2018 Action Given Dose 150 mg Route Intramuscular Administered By Katrina Stack, RN

## 2018-10-28 NOTE — Progress Notes (Signed)
Agree with A & P. 

## 2019-01-14 ENCOUNTER — Ambulatory Visit: Payer: Medicaid Other

## 2019-01-15 ENCOUNTER — Telehealth (INDEPENDENT_AMBULATORY_CARE_PROVIDER_SITE_OTHER): Payer: Medicaid Other | Admitting: Obstetrics

## 2019-01-15 ENCOUNTER — Encounter: Payer: Self-pay | Admitting: Obstetrics

## 2019-01-15 DIAGNOSIS — Z3009 Encounter for other general counseling and advice on contraception: Secondary | ICD-10-CM | POA: Diagnosis not present

## 2019-01-15 DIAGNOSIS — Z30011 Encounter for initial prescription of contraceptive pills: Secondary | ICD-10-CM | POA: Diagnosis not present

## 2019-01-15 DIAGNOSIS — N939 Abnormal uterine and vaginal bleeding, unspecified: Secondary | ICD-10-CM | POA: Diagnosis not present

## 2019-01-15 DIAGNOSIS — Z3042 Encounter for surveillance of injectable contraceptive: Secondary | ICD-10-CM

## 2019-01-15 MED ORDER — ORTHO-NOVUM 1/35 (28) 1-35 MG-MCG PO TABS: 1.0000 | ORAL_TABLET | Freq: Every day | ORAL | 11 refills | Status: DC

## 2019-01-15 NOTE — Progress Notes (Signed)
Pt reports abnormal bleeding for the past year. She has been on depo injections for almost 3 years now. Pt would like to talk about other birth control options.

## 2019-01-15 NOTE — Progress Notes (Signed)
    TELEHEALTH GYNECOLOGY VIRTUAL VIDEO VISIT ENCOUNTER NOTE  Provider location: Center for [facility] at [place]   I connected with Stacey Shepard on 01/15/19 at 10:30 AM EDT by WebEx Gyn MyChart Video Encounter at home and verified that I am speaking with the correct person using two identifiers.   I discussed the limitations, risks, security and privacy concerns of performing an evaluation and management service virtually and the availability of in person appointments. I also discussed with the patient that there may be a patient responsible charge related to this service. The patient expressed understanding and agreed to proceed.   History:  Stacey Shepard is a 28 y.o. [phone] female being evaluated today for irregular vaginal bleeding with Depo Provera.  Wants to switch to a different contraceptive. She denies any abnormal vaginal discharge, pelvic pain or other concerns.       Past Medical History:  Diagnosis Date  . Hx of chlamydia infection   . Medical history non-contributory    Past Surgical History:  Procedure Laterality Date  . DILATION AND CURETTAGE OF UTERUS N/A 01/09/2016   Procedure: DILATATION AND CURETTAGE;  Surgeon: Donnamae Jude, MD;  Location: [facility];  Service: Gynecology;  Laterality: N/A;  . NO PAST SURGERIES     The following portions of the patient's history were reviewed and updated as appropriate: allergies, current medications, past family history, past medical history, past social history, past surgical history and problem list.   Health Maintenance:  Normal pap and negative HRHPV on 08-27-2018.   Review of Systems:  Pertinent items noted in HPI and remainder of comprehensive ROS otherwise negative.  Physical Exam:   General:  Alert, oriented and cooperative. Patient appears to be in no acute distress.  Mental Status: Normal mood and affect. Normal behavior. Normal judgment and thought content.   Respiratory: Normal respiratory effort, no  problems with respiration noted  Rest of physical exam deferred due to type of encounter  Labs and Imaging No results found for this or any previous visit (from the past 336 hour(s)). No results found.     Assessment and Plan:     1. Encounter for surveillance of injectable contraceptive  2. Abnormal uterine bleeding (AUB)  3. Encounter for other general counseling and advice on contraception - wants OCP's  4. Encounter for initial prescription of contraceptive pills Rx: - norethindrone-ethinyl estradiol 1/35 ([place] 1/35, 28,) tablet; Take 1 tablet by mouth daily.  Dispense: 1 Package; Refill: 11      I discussed the assessment and treatment plan with the patient. The patient was provided an opportunity to ask questions and all were answered. The patient agreed with the plan and demonstrated an understanding of the instructions.   The patient was advised to call back or seek an in-person evaluation/go to the ED if the symptoms worsen or if the condition fails to improve as anticipated.  I provided 15 minutes of face-to-face time during this encounter.   Stacey Najjar, MD Center for [facility], [facility] 01-15-2019

## 2019-01-19 ENCOUNTER — Ambulatory Visit: Payer: Medicaid Other

## 2019-02-25 ENCOUNTER — Encounter: Payer: Self-pay | Admitting: Obstetrics and Gynecology

## 2019-02-25 ENCOUNTER — Other Ambulatory Visit (HOSPITAL_COMMUNITY)
Admission: RE | Admit: 2019-02-25 | Discharge: 2019-02-25 | Disposition: A | Discharge status: Home / Self Care | Payer: Medicaid Other | Source: Ambulatory Visit | Attending: Obstetrics and Gynecology | Admitting: Obstetrics and Gynecology

## 2019-02-25 ENCOUNTER — Other Ambulatory Visit: Payer: Self-pay

## 2019-02-25 ENCOUNTER — Ambulatory Visit: Payer: Medicaid Other | Admitting: Obstetrics and Gynecology

## 2019-02-25 VITALS — BP 90/57 | HR 76 | Ht 64.0 in | Wt 120.0 lb

## 2019-02-25 DIAGNOSIS — Z23 Encounter for immunization: Secondary | ICD-10-CM

## 2019-02-25 DIAGNOSIS — Z0000 Encounter for general adult medical examination without abnormal findings: Secondary | ICD-10-CM | POA: Diagnosis not present

## 2019-02-25 DIAGNOSIS — Z01419 Encounter for gynecological examination (general) (routine) without abnormal findings: Secondary | ICD-10-CM | POA: Diagnosis not present

## 2019-02-25 NOTE — Progress Notes (Signed)
Subjective:     Stacey Shepard is a 28 y.o. female P3 with LMP 01/05/19 and BMI 20 who is here for a comprehensive physical exam. The patient reports no problems. She reports a monthly period of 3-5 days. She is sexually active using COC for contraception. She denies pelvic pain or abnormal discharge.   Past Medical History:  Diagnosis Date  . Hx of chlamydia infection   . Medical history non-contributory    Past Surgical History:  Procedure Laterality Date  . DILATION AND CURETTAGE OF UTERUS N/A 01/09/2016   Procedure: DILATATION AND CURETTAGE;  Surgeon: Donnamae Jude, MD;  Location: [facility];  Service: Gynecology;  Laterality: N/A;  . NO PAST SURGERIES     Family History  Problem Relation Age of Onset  . Cancer Paternal Aunt        stomach  . Breast cancer Paternal Aunt     Social History   Socioeconomic History  . Marital status: Single    Spouse name: Not on file  . Number of children: Not on file  . Years of education: Not on file  . Highest education level: Not on file  Occupational History  . Not on file  Social Needs  . Financial resource strain: Not on file  . Food insecurity    Worry: Not on file    Inability: Not on file  . Transportation needs    Medical: Not on file    Non-medical: Not on file  Tobacco Use  . Smoking status: Never Smoker  . Smokeless tobacco: Never Used  Substance and Sexual Activity  . Alcohol use: No    Alcohol/week: 0.0 standard drinks  . Drug use: No  . Sexual activity: Yes    Partners: Male    Birth control/protection: Injection  Lifestyle  . Physical activity    Days per week: Not on file    Minutes per session: Not on file  . Stress: Not on file  Relationships  . Social Herbalist on phone: Not on file    Gets together: Not on file    Attends religious service: Not on file    Active member of club or organization: Not on file    Attends meetings of clubs or organizations: Not on file    Relationship status:  Not on file  . Intimate partner violence    Fear of current or ex partner: Not on file    Emotionally abused: Not on file    Physically abused: Not on file    Forced sexual activity: Not on file  Other Topics Concern  . Not on file  Social History Narrative  . Not on file   Health Maintenance  Topic Date Due  . INFLUENZA VACCINE  01/17/2019  . PAP-Cervical Cytology Screening  08/26/2020  . PAP SMEAR-Modifier  08/26/2020  . TETANUS/TDAP  12/28/2025  . HIV Screening  Completed       Review of Systems Pertinent items are noted in HPI.   Objective:  Blood pressure (!) 90/57, pulse 76, height 5\' 4"  (1.626 m), weight 120 lb (54.4 kg).     GENERAL: Well-developed, well-nourished female in no acute distress.  HEENT: Normocephalic, atraumatic. Sclerae anicteric.  NECK: Supple. Normal thyroid.  LUNGS: Clear to auscultation bilaterally.  HEART: Regular rate and rhythm. BREASTS: Symmetric in size. No palpable masses or lymphadenopathy, skin changes, or nipple drainage. ABDOMEN: Soft, nontender, nondistended. No organomegaly. PELVIC: Normal external female genitalia. Vagina is pink and  rugated.  Normal discharge. Normal appearing cervix. Uterus is normal in size. No adnexal mass or tenderness. EXTREMITIES: No cyanosis, clubbing, or edema, 2+ distal pulses.    Assessment:    Healthy female exam.      Plan:    Pap smear collected STI screen per patient request Patient will be contacted with abnormal results Flu vaccine today RTC prn See After Visit Summary for Counseling Recommendations

## 2019-02-26 LAB — CERVICOVAGINAL ANCILLARY ONLY
Chlamydia: NEGATIVE
Neisseria Gonorrhea: NEGATIVE

## 2019-02-26 LAB — HIV ANTIBODY (ROUTINE TESTING W REFLEX): HIV Screen 4th Generation wRfx: NONREACTIVE

## 2019-02-26 LAB — HEPATITIS B SURFACE ANTIGEN: Hepatitis B Surface Ag: NEGATIVE

## 2019-02-26 LAB — RPR: RPR Ser Ql: NONREACTIVE

## 2019-02-26 LAB — HEPATITIS C ANTIBODY: Hep C Virus Ab: <0.1 s/co ratio (ref 0.0–0.9)

## 2019-02-28 LAB — CYTOLOGY - PAP
Diagnosis: UNDETERMINED — AB
HPV: DETECTED — AB

## 2019-03-04 ENCOUNTER — Telehealth: Payer: Self-pay

## 2019-03-04 ENCOUNTER — Other Ambulatory Visit: Payer: Self-pay | Admitting: Obstetrics and Gynecology

## 2019-03-04 DIAGNOSIS — Z30011 Encounter for initial prescription of contraceptive pills: Secondary | ICD-10-CM

## 2019-03-04 MED ORDER — MEDROXYPROGESTERONE ACETATE 150 MG/ML IM SUSP: 150.0000 mg | INTRAMUSCULAR | 4 refills | Status: DC

## 2019-03-04 MED ORDER — ORTHO-NOVUM 1/35 (28) 1-35 MG-MCG PO TABS: 1.0000 | ORAL_TABLET | Freq: Every day | ORAL | 11 refills | Status: DC

## 2019-03-04 NOTE — Telephone Encounter (Signed)
Return call to pt regarding message about losing birth control pills  Pt wants to get on depo Pt hung up after call connected I was not able to speak with pt

## 2019-03-04 NOTE — Telephone Encounter (Signed)
TC from pt requesting to change from OCP to Depo Pt states she is not doing well with keeping up with birth control pills and recently lost them  Pt asked if another Rx can be sent and if she can get back on depo.

## 2019-03-05 ENCOUNTER — Telehealth: Payer: Self-pay

## 2019-03-05 NOTE — Telephone Encounter (Signed)
I called patient to make her aware that Birth Control was sent and medical advice that was given. Pt stated she has not had intercourse however went ahead and took BCP's after missing 2 days and has started some bleeding after missing pills.pt wants to start depo asap.

## 2019-03-10 ENCOUNTER — Ambulatory Visit (INDEPENDENT_AMBULATORY_CARE_PROVIDER_SITE_OTHER): Payer: Medicaid Other

## 2019-03-10 ENCOUNTER — Other Ambulatory Visit: Payer: Self-pay

## 2019-03-10 DIAGNOSIS — Z3202 Encounter for pregnancy test, result negative: Secondary | ICD-10-CM

## 2019-03-10 DIAGNOSIS — Z3042 Encounter for surveillance of injectable contraceptive: Secondary | ICD-10-CM | POA: Diagnosis not present

## 2019-03-10 LAB — POCT URINE PREGNANCY: Preg Test, Ur: NEGATIVE

## 2019-03-10 MED ORDER — MEDROXYPROGESTERONE ACETATE 150 MG/ML IM SUSP
150.0000 mg | INTRAMUSCULAR | Status: DC
  Administered 2019-03-10 – 2019-11-17 (×2): 150 mg via INTRAMUSCULAR

## 2019-03-10 NOTE — Progress Notes (Addendum)
Nurse visit for Depo Switching from BCP to Depo  Denies laps in pills Depo given RUOQ without difficulty UPT neg  Attestation of Attending Supervision of CMA/ RN: Evaluation and management procedures were performed by the nurse under my supervision and collaboration.  I have reviewed the nursing note and chart, and I agree with the management and plan.  Carolyn L. Harraway-Smith, M.D., Cherlynn June

## 2019-03-30 ENCOUNTER — Other Ambulatory Visit (HOSPITAL_COMMUNITY)
Admission: RE | Admit: 2019-03-30 | Discharge: 2019-03-30 | Disposition: A | Discharge status: Home / Self Care | Payer: Medicaid Other | Source: Ambulatory Visit | Attending: Obstetrics and Gynecology | Admitting: Obstetrics and Gynecology

## 2019-03-30 ENCOUNTER — Ambulatory Visit: Payer: Medicaid Other | Admitting: Obstetrics and Gynecology

## 2019-03-30 ENCOUNTER — Encounter: Payer: Self-pay | Admitting: Obstetrics and Gynecology

## 2019-03-30 ENCOUNTER — Other Ambulatory Visit: Payer: Self-pay

## 2019-03-30 DIAGNOSIS — Z3202 Encounter for pregnancy test, result negative: Secondary | ICD-10-CM

## 2019-03-30 DIAGNOSIS — N870 Mild cervical dysplasia: Secondary | ICD-10-CM

## 2019-03-30 DIAGNOSIS — R87810 Cervical high risk human papillomavirus (HPV) DNA test positive: Secondary | ICD-10-CM | POA: Insufficient documentation

## 2019-03-30 DIAGNOSIS — R87610 Atypical squamous cells of undetermined significance on cytologic smear of cervix (ASC-US): Secondary | ICD-10-CM | POA: Insufficient documentation

## 2019-03-30 LAB — POCT URINE PREGNANCY: Preg Test, Ur: NEGATIVE

## 2019-03-30 NOTE — Progress Notes (Signed)
Pt is here for colpo. Pap on 02/25/19 abnormal, ASCUS with positive HPV.

## 2019-03-30 NOTE — Progress Notes (Signed)
28 yo with ASCUS + HPV on pap smear here for colposcopy  Patient given informed consent, signed copy in the chart, time out was performed.  Placed in lithotomy position. Cervix viewed with speculum and colposcope after application of acetic acid.   Colposcopy adequate?  yes Acetowhite lesions? Yes at 2 and 7 o'clock Punctation? no Mosaicism?  no Abnormal vasculature?  no Biopsies? 2 and 7 o'clock ECC? yes  COMMENTS:  Patient was given post procedure instructions.  She will return in 2 weeks for results.  Mora Bellman, MD

## 2019-04-01 LAB — SURGICAL PATHOLOGY

## 2019-06-02 ENCOUNTER — Ambulatory Visit (INDEPENDENT_AMBULATORY_CARE_PROVIDER_SITE_OTHER): Payer: Medicaid Other

## 2019-06-02 ENCOUNTER — Other Ambulatory Visit: Payer: Self-pay

## 2019-06-02 VITALS — BP 104/64 | HR 90 | Ht 64.0 in | Wt 125.0 lb

## 2019-06-02 DIAGNOSIS — Z3042 Encounter for surveillance of injectable contraceptive: Secondary | ICD-10-CM

## 2019-06-02 MED ORDER — MEDROXYPROGESTERONE ACETATE 150 MG/ML IM SUSP
150.0000 mg | Freq: Once | INTRAMUSCULAR | Status: AC
  Administered 2019-06-02: 150 mg via INTRAMUSCULAR

## 2019-06-02 NOTE — Progress Notes (Signed)
GYN presents for DEPO, injection given in LUOQ, tolerated well.  Next DEPO March 2-16/2021  Administrations This Visit    medroxyPROGESTERone (DEPO-PROVERA) injection 150 mg    Admin Date 06/02/2019 Action Given Dose 150 mg Route Intramuscular Administered By Tamela Oddi, RMA

## 2019-08-25 ENCOUNTER — Ambulatory Visit (INDEPENDENT_AMBULATORY_CARE_PROVIDER_SITE_OTHER): Payer: Medicaid Other

## 2019-08-25 ENCOUNTER — Other Ambulatory Visit: Payer: Self-pay

## 2019-08-25 DIAGNOSIS — Z3042 Encounter for surveillance of injectable contraceptive: Secondary | ICD-10-CM | POA: Diagnosis not present

## 2019-08-25 MED ORDER — MEDROXYPROGESTERONE ACETATE 150 MG/ML IM SUSP
150.0000 mg | Freq: Once | INTRAMUSCULAR | Status: AC
  Administered 2019-08-25: 150 mg via INTRAMUSCULAR

## 2019-08-25 NOTE — Progress Notes (Signed)
Pt is here for a depo injection.  She tolerated injection well in the RUOQ without complication. Next appointment should be between 5/22 - 6/5. -EH/RMA

## 2019-08-26 NOTE — Progress Notes (Signed)
Patient ID: Stacey Shepard, female   DOB: Mar 26, 1991, 29 y.o.   MRN: [id number] Patient seen and assessed by nursing staff during this encounter. I have reviewed the chart and agree with the documentation and plan.  Scheryl Darter, MD 08/26/2019 10:09 AM

## 2019-09-14 ENCOUNTER — Other Ambulatory Visit: Payer: Self-pay

## 2019-09-14 ENCOUNTER — Ambulatory Visit (INDEPENDENT_AMBULATORY_CARE_PROVIDER_SITE_OTHER): Payer: Medicaid Other

## 2019-09-14 ENCOUNTER — Other Ambulatory Visit (HOSPITAL_COMMUNITY)
Admission: RE | Admit: 2019-09-14 | Discharge: 2019-09-14 | Disposition: A | Discharge status: Home / Self Care | Payer: Medicaid Other | Source: Ambulatory Visit | Attending: Obstetrics and Gynecology | Admitting: Obstetrics and Gynecology

## 2019-09-14 VITALS — BP 107/67 | HR 101 | Ht 64.0 in | Wt 125.0 lb

## 2019-09-14 DIAGNOSIS — Z113 Encounter for screening for infections with a predominantly sexual mode of transmission: Secondary | ICD-10-CM | POA: Insufficient documentation

## 2019-09-14 NOTE — Progress Notes (Signed)
SUBJECTIVE:  29 y.o. female presents for routine STD Screening. Denies discharge, odor, abnormal vaginal bleeding or significant pelvic pain or fever. No UTI symptoms. Denies history of known exposure to STD.  No LMP recorded. Patient has had an injection.  OBJECTIVE:  She appears well, afebrile. Urine dipstick: not done.  ASSESSMENT:  Routine STD Screening   PLAN:  GC, chlamydia, trichomonas, BVAG, CVAG probe and blood sent to lab.  Treatment: To be determined once lab results are received ROV prn if symptoms persist or worsen.

## 2019-09-14 NOTE — Progress Notes (Signed)
I reviewed the nurses note and agree with the plan of care.   Sanay Belmar A, MD 09/13/2017 10:46 AM  

## 2019-09-15 ENCOUNTER — Other Ambulatory Visit: Payer: Self-pay | Admitting: Obstetrics

## 2019-09-15 DIAGNOSIS — A599 Trichomoniasis, unspecified: Secondary | ICD-10-CM

## 2019-09-15 DIAGNOSIS — B9689 Other specified bacterial agents as the cause of diseases classified elsewhere: Secondary | ICD-10-CM

## 2019-09-15 LAB — HEPATITIS B SURFACE ANTIGEN: Hepatitis B Surface Ag: NEGATIVE

## 2019-09-15 LAB — RPR: RPR Ser Ql: NONREACTIVE

## 2019-09-15 LAB — CERVICOVAGINAL ANCILLARY ONLY
Bacterial Vaginitis (gardnerella): POSITIVE — AB
Candida Glabrata: NEGATIVE
Candida Vaginitis: NEGATIVE
Chlamydia: NEGATIVE
Comment: NEGATIVE
Comment: NEGATIVE
Comment: NEGATIVE
Comment: NEGATIVE
Comment: NEGATIVE
Comment: NORMAL
Neisseria Gonorrhea: NEGATIVE
Trichomonas: POSITIVE — AB

## 2019-09-15 LAB — HIV ANTIBODY (ROUTINE TESTING W REFLEX): HIV Screen 4th Generation wRfx: NONREACTIVE

## 2019-09-15 LAB — HEPATITIS C ANTIBODY: Hep C Virus Ab: <0.1 s/co ratio (ref 0.0–0.9)

## 2019-09-15 MED ORDER — METRONIDAZOLE 500 MG PO TABS: 500.0000 mg | ORAL_TABLET | Freq: Two times a day (BID) | ORAL | 2 refills | Status: DC

## 2019-09-28 ENCOUNTER — Ambulatory Visit
Admission: AD | Admit: 2019-09-28 | Discharge: 2019-09-28 | Disposition: A | Discharge status: Home / Self Care | Payer: Medicaid Other | Attending: Physician Assistant | Admitting: Physician Assistant

## 2019-09-28 DIAGNOSIS — N898 Other specified noninflammatory disorders of vagina: Secondary | ICD-10-CM | POA: Insufficient documentation

## 2019-09-28 MED ORDER — METRONIDAZOLE 500 MG PO TABS: 2000.0000 mg | ORAL_TABLET | Freq: Once | ORAL | 0 refills | Status: AC

## 2019-09-28 NOTE — ED Triage Notes (Signed)
Pt states her partner tested positive for Trich. States she is having small vaginal discharge with no odor and burning on urination sometimes x2days

## 2019-09-28 NOTE — Discharge Instructions (Signed)
You were treated empirically for trichomonas. Flagyl 4 pills (2000mg ) at once. Cytology sent, you will be contacted with any positive results that requires further treatment. Refrain from sexual activity and alcohol use for the next 7 days. Monitor for any worsening of symptoms, fever, abdominal pain, nausea, vomiting, to follow up for reevaluation.

## 2019-09-28 NOTE — ED Provider Notes (Signed)
[name]    CSN: [id number] Arrival date & time: 09/28/19  1111      History   Chief Complaint Chief Complaint  Patient presents with  . Exposure to STD    HPI Stacey Shepard is a 29 y.o. female.   29 year old female comes in for 2 day history of vaginal discharge. Has positive exposure to trichomonas. Some vaginal itching. Denies vaginal odor. Has had some spotting since switching to Depo-Provera 4 to 5 months ago.  Has had intermittent dysuria without frequency, urgency, hematuria. Denies fever, chills, body aches. Denies abdominal pain, nausea, vomiting.  Sexually active with one female partner, no condom use.      Past Medical History:  Diagnosis Date  . Hx of chlamydia infection   . Medical history non-contributory     Patient Active Problem List   Diagnosis Date Noted  . ASCUS with positive high risk HPV cervical 03/30/2019  . Gynecologic exam normal 09/02/2018  . Possible exposure to STD 09/02/2018  . Depo-Provera contraceptive status 12/24/2016    Past Surgical History:  Procedure Laterality Date  . DILATION AND CURETTAGE OF UTERUS N/A 01/09/2016   Procedure: DILATATION AND CURETTAGE;  Surgeon: Stacey Shepard;  Location: [facility];  Service: Gynecology;  Laterality: N/A;  . NO PAST SURGERIES      OB History    Gravida  3   Para  3   Term  3   Preterm  0   AB  0   Living  3     SAB  0   TAB  0   Ectopic  0   Multiple  0   Live Births  3            Home Medications    Prior to Admission medications   Medication Sig Start Date End Date Taking? Authorizing Provider  ferrous sulfate 325 (65 FE) MG tablet Take 1 tablet (325 mg total) by mouth 2 (two) times daily with a meal. 01/10/16   Stacey Dickens, Shepard  ibuprofen (ADVIL,MOTRIN) 600 MG tablet Take 1 tablet (600 mg total) by mouth every 6 (six) hours as needed (mild pain). Patient not taking: Reported on 10/28/2018 01/10/16   Stacey Dickens, Shepard    medroxyPROGESTERone (DEPO-PROVERA) 150 MG/ML injection Inject 1 mL (150 mg total) into the muscle every 3 (three) months. 03/04/19   Stacey Shepard  metroNIDAZOLE (FLAGYL) 500 MG tablet Take 4 tablets (2,000 mg total) by mouth once for 1 dose. 09/28/19 09/28/19  Ok Edwards, PA-C  norethindrone-ethinyl estradiol 1/35 ([place] 1/35, 28,) tablet Take 1 tablet by mouth daily. 03/04/19   Stacey Shepard  Prenatal Vit-Fe Fumarate-FA (PRENATAL MULTIVITAMIN) TABS tablet Take 1 tablet by mouth daily at 12 noon.    Provider, Historical, Shepard  senna-docusate (SENOKOT-S) 8.6-50 MG tablet Take 2 tablets by mouth 2 (two) times daily. Patient not taking: Reported on 10/28/2018 12/30/15   Stacey Shepard    Family History Family History  Problem Relation Age of Onset  . Cancer Paternal Aunt        stomach  . Breast cancer Paternal Aunt     Social History Social History   Tobacco Use  . Smoking status: Never Smoker  . Smokeless tobacco: Never Used  Substance Use Topics  . Alcohol use: No    Alcohol/week: 0.0 standard drinks  . Drug use: No     Allergies   Patient has no known  allergies.   Review of Systems Review of Systems  Reason unable to perform ROS: See HPI as above.     Physical Exam Triage Vital Signs ED Triage Vitals  Enc Vitals Group     BP 09/28/19 1126 109/71     Pulse Rate 09/28/19 1126 87     Resp 09/28/19 1126 18     Temp 09/28/19 1126 98 F (36.7 C)     Temp Source 09/28/19 1126 Oral     SpO2 09/28/19 1126 99 %     Weight --      Height --      Head Circumference --      Peak Flow --      Pain Score 09/28/19 1138 0     Pain Loc --      Pain Edu? --      Excl. in GC? --    No data found.  Updated Vital Signs BP 109/71 (BP Location: Left Arm)   Pulse 87   Temp 98 F (36.7 C) (Oral)   Resp 18   SpO2 99%   Physical Exam Constitutional:      General: She is not in acute distress.    Appearance: She is well-developed. She is not  ill-appearing, toxic-appearing or diaphoretic.  HENT:     Head: Normocephalic and atraumatic.  Eyes:     Conjunctiva/sclera: Conjunctivae normal.     Pupils: Pupils are equal, round, and reactive to light.  Cardiovascular:     Rate and Rhythm: Normal rate and regular rhythm.  Pulmonary:     Effort: Pulmonary effort is normal. No respiratory distress.     Comments: LCTAB Abdominal:     General: Bowel sounds are normal.     Palpations: Abdomen is soft.     Tenderness: There is no abdominal tenderness. There is no right CVA tenderness, left CVA tenderness, guarding or rebound.  Musculoskeletal:     Cervical back: Normal range of motion and neck supple.  Skin:    General: Skin is warm and dry.  Neurological:     Mental Status: She is alert and oriented to person, place, and time.  Psychiatric:        Behavior: Behavior normal.        Judgment: Judgment normal.      UC Treatments / Results  Labs (all labs ordered are listed, but only abnormal results are displayed) Labs Reviewed  CERVICOVAGINAL ANCILLARY ONLY    EKG   Radiology No results found.  Procedures Procedures (including critical care time)  Medications Ordered in UC Medications - No data to display  Initial Impression / Assessment and Plan / UC Course  I have reviewed the triage vital signs and the nursing notes.  Pertinent labs & imaging results that were available during my care of the patient were reviewed by me and considered in my medical decision making (see chart for details).    Patient was treated empirically for trichomonas.  Has not had food this morning, will call Flagyl into pharmacy, and have patient take 2 g after food.  Cytology sent, patient will be contacted with any positive results that require additional treatment.  We will also test for BV to assess for current spotting.  If BV negative, patient to follow-up with PCP/OB/GYN if continues with spotting after 6 months of new birth control.   Patient to refrain from sexual activity for the next 7 days. Return precautions given.  Patient expresses understanding and agrees to plan.  Final Clinical Impressions(s) / UC Diagnoses   Final diagnoses:  Vaginal discharge   ED Prescriptions    Medication Sig Dispense Auth. Provider   metroNIDAZOLE (FLAGYL) 500 MG tablet Take 4 tablets (2,000 mg total) by mouth once for 1 dose. 4 tablet Belinda Fisher, PA-C     PDMP not reviewed this encounter.   Belinda Fisher, PA-C 09/28/19 1208

## 2019-09-29 ENCOUNTER — Telehealth (HOSPITAL_COMMUNITY): Payer: Self-pay

## 2019-09-29 LAB — CERVICOVAGINAL ANCILLARY ONLY
Bacterial Vaginitis (gardnerella): NEGATIVE
Candida Glabrata: NEGATIVE
Candida Vaginitis: POSITIVE — AB
Chlamydia: NEGATIVE
Comment: NEGATIVE
Comment: NEGATIVE
Comment: NEGATIVE
Comment: NEGATIVE
Comment: NEGATIVE
Comment: NORMAL
Neisseria Gonorrhea: NEGATIVE
Trichomonas: NEGATIVE

## 2019-09-29 MED ORDER — FLUCONAZOLE 150 MG PO TABS: 150.0000 mg | ORAL_TABLET | Freq: Every day | ORAL | 0 refills | Status: AC

## 2019-10-05 ENCOUNTER — Other Ambulatory Visit: Payer: Self-pay

## 2019-10-05 ENCOUNTER — Ambulatory Visit
Admission: EM | Admit: 2019-10-05 | Discharge: 2019-10-05 | Disposition: A | Discharge status: Home / Self Care | Payer: Medicaid Other | Attending: Emergency Medicine | Admitting: Emergency Medicine

## 2019-10-05 ENCOUNTER — Encounter: Payer: Self-pay | Admitting: Emergency Medicine

## 2019-10-05 DIAGNOSIS — R0981 Nasal congestion: Secondary | ICD-10-CM | POA: Diagnosis not present

## 2019-10-05 DIAGNOSIS — R0789 Other chest pain: Secondary | ICD-10-CM | POA: Diagnosis not present

## 2019-10-05 MED ORDER — FLUTICASONE PROPIONATE 50 MCG/ACT NA SUSP: 1.0000 | Freq: Every day | NASAL | 0 refills | Status: DC

## 2019-10-05 MED ORDER — CETIRIZINE HCL 10 MG PO TABS: 10.0000 mg | ORAL_TABLET | Freq: Every day | ORAL | 0 refills | Status: DC

## 2019-10-05 NOTE — ED Provider Notes (Signed)
[name]    CSN: [id number] Arrival date & time: 10/05/19  1113      History   Chief Complaint Chief Complaint  Patient presents with  . Headache  . Chest Pain    HPI Stacey Shepard is a 29 y.o. female presenting for 2-day course of nasal congestion, generalized headache, right lower rib cage pain.  Patient states pain is worse with movement: No chest pain, difficulty breathing, cough.  Patient denies known sick contacts, though does go to work in person.  Patient tried Benadryl without relief. Patient also inquiring about STD testing: Currently sexually active with 1 female partner, not routinely using condoms.  Patient receiving Depo injection every 3 months: Last injection was February/March.  Patient denying vaginal discharge, pelvic pain, urinary symptoms such as frequency or urgency.   Past Medical History:  Diagnosis Date  . Hx of chlamydia infection   . Medical history non-contributory     Patient Active Problem List   Diagnosis Date Noted  . ASCUS with positive high risk HPV cervical 03/30/2019  . Gynecologic exam normal 09/02/2018  . Possible exposure to STD 09/02/2018  . Depo-Provera contraceptive status 12/24/2016    Past Surgical History:  Procedure Laterality Date  . DILATION AND CURETTAGE OF UTERUS N/A 01/09/2016   Procedure: DILATATION AND CURETTAGE;  Surgeon: Reva Bores, MD;  Location: [facility];  Service: Gynecology;  Laterality: N/A;  . NO PAST SURGERIES      OB History    Gravida  3   Para  3   Term  3   Preterm  0   AB  0   Living  3     SAB  0   TAB  0   Ectopic  0   Multiple  0   Live Births  3            Home Medications    Prior to Admission medications   Medication Sig Start Date End Date Taking? Authorizing Provider  ferrous sulfate 325 (65 FE) MG tablet Take 1 tablet (325 mg total) by mouth 2 (two) times daily with a meal. 01/10/16  Yes Lorne Skeens, MD  cetirizine (ZYRTEC ALLERGY) 10  MG tablet Take 1 tablet (10 mg total) by mouth daily. 10/05/19   Hall-Potvin, [place], PA-C  fluticasone (FLONASE) 50 MCG/ACT nasal spray Place 1 spray into both nostrils daily. 10/05/19   Hall-Potvin, [place], PA-C  ibuprofen (ADVIL,MOTRIN) 600 MG tablet Take 1 tablet (600 mg total) by mouth every 6 (six) hours as needed (mild pain). Patient not taking: Reported on 10/28/2018 01/10/16   Lorne Skeens, MD  medroxyPROGESTERone (DEPO-PROVERA) 150 MG/ML injection Inject 1 mL (150 mg total) into the muscle every 3 (three) months. 03/04/19   Constant, Peggy, MD  senna-docusate (SENOKOT-S) 8.6-50 MG tablet Take 2 tablets by mouth 2 (two) times daily. Patient not taking: Reported on 10/28/2018 12/30/15   Orvilla Cornwall A, CNM  norethindrone-ethinyl estradiol 1/35 (ORTHO-NOVUM 1/35, 28,) tablet Take 1 tablet by mouth daily. 03/04/19 10/05/19  Constant, Peggy, MD    Family History Family History  Problem Relation Age of Onset  . Cancer Paternal Aunt        stomach  . Breast cancer Paternal Aunt     Social History Social History   Tobacco Use  . Smoking status: Never Smoker  . Smokeless tobacco: Never Used  Substance Use Topics  . Alcohol use: No    Alcohol/week: 0.0 standard drinks  .  Drug use: No     Allergies   Patient has no known allergies.   Review of Systems As per HPI   Physical Exam Triage Vital Signs ED Triage Vitals  Enc Vitals Group     BP      Pulse      Resp      Temp      Temp src      SpO2      Weight      Height      Head Circumference      Peak Flow      Pain Score      Pain Loc      Pain Edu?      Excl. in GC?    No data found.  Updated Vital Signs BP 112/73 (BP Location: Left Arm)   Pulse 78   Temp 98 F (36.7 C) (Oral)   Resp 16   SpO2 100%   Visual Acuity Right Eye Distance:   Left Eye Distance:   Bilateral Distance:    Right Eye Near:   Left Eye Near:    Bilateral Near:     Physical Exam Constitutional:      General:  She is not in acute distress.    Appearance: She is well-developed and normal weight. She is not ill-appearing.  HENT:     Head: Normocephalic and atraumatic.     Jaw: There is normal jaw occlusion. No tenderness or pain on movement.     Right Ear: Hearing, tympanic membrane, ear canal and external ear normal. No tenderness. No mastoid tenderness.     Left Ear: Hearing, tympanic membrane, ear canal and external ear normal. No tenderness. No mastoid tenderness.     Nose: No nasal deformity, septal deviation or nasal tenderness.     Right Turbinates: Not swollen or pale.     Left Turbinates: Not swollen or pale.     Right Sinus: No maxillary sinus tenderness or frontal sinus tenderness.     Left Sinus: No maxillary sinus tenderness or frontal sinus tenderness.     Mouth/Throat:     Lips: Pink. No lesions.     Mouth: Mucous membranes are moist. No injury.     Pharynx: Oropharynx is clear. Uvula midline. No posterior oropharyngeal erythema or uvula swelling.     Comments: no tonsillar exudate or hypertrophy Cardiovascular:     Rate and Rhythm: Normal rate and regular rhythm.     Heart sounds: No murmur. No friction rub. No gallop.   Pulmonary:     Effort: Pulmonary effort is normal. No respiratory distress.     Breath sounds: No wheezing.     Comments: Mild chest tenderness, right side.  No bony deformity. Musculoskeletal:        General: Normal range of motion.     Cervical back: Normal range of motion and neck supple. No muscular tenderness.     Right lower leg: No edema.     Left lower leg: No edema.     Comments: No spinous process tenderness of C, T, L-spines  Lymphadenopathy:     Cervical: No cervical adenopathy.  Skin:    General: Skin is warm.     Capillary Refill: Capillary refill takes less than 2 seconds.     Coloration: Skin is not jaundiced.     Findings: No bruising.  Neurological:     General: No focal deficit present.     Mental Status: She is alert and  oriented to  person, place, and time.      UC Treatments / Results  Labs (all labs ordered are listed, but only abnormal results are displayed) Labs Reviewed  NOVEL CORONAVIRUS, NAA  CERVICOVAGINAL ANCILLARY ONLY    EKG   Radiology No results found.  Procedures Procedures (including critical care time)  Medications Ordered in UC Medications - No data to display  Initial Impression / Assessment and Plan / UC Course  I have reviewed the triage vital signs and the nursing notes.  Pertinent labs & imaging results that were available during my care of the patient were reviewed by me and considered in my medical decision making (see chart for details).     Patient afebrile, nontoxic, with SpO2 100%.  Covid PCR pending.  Patient to quarantine until results are back.  We will continue supportive management as outlined below.  Regarding chest pain: Low concern for cardiopulmonary etiology: Likely musculoskeletal.  Will trial NSAID.  Return precautions discussed, patient verbalized understanding and is agreeable to plan. Final Clinical Impressions(s) / UC Diagnoses   Final diagnoses:  Nasal congestion  Muscular chest pain     Discharge Instructions     Your COVID test is pending - it is important to quarantine / isolate at home until your results are back. If you test positive and would like further evaluation for persistent or worsening symptoms, you may schedule an E-visit or virtual (video) visit throughout the [facility] app or website.  PLEASE NOTE: If you develop severe chest pain or shortness of breath please go to the ER or call 9-1-1 for further evaluation --> DO NOT schedule electronic or virtual visits for this. Please call our office for further guidance / recommendations as needed.  For information about the Covid vaccine, please visit [email or web address]    ED Prescriptions    Medication Sig Dispense Auth. Provider   fluticasone (FLONASE) 50 MCG/ACT nasal  spray Place 1 spray into both nostrils daily. 16 g Hall-Potvin, [place], PA-C   cetirizine (ZYRTEC ALLERGY) 10 MG tablet Take 1 tablet (10 mg total) by mouth daily. 30 tablet Hall-Potvin, [place], PA-C     PDMP not reviewed this encounter.   Hall-Potvin, [place], [place] 10/05/19 1302

## 2019-10-05 NOTE — ED Triage Notes (Addendum)
10/03/2019 patient complains of being stuffy, having headache and right lower ribcage pain.  Pain in right ribcage hurst with movement   Patient is also asking about having std check up

## 2019-10-05 NOTE — Discharge Instructions (Signed)
Your COVID test is pending - it is important to quarantine / isolate at home until your results are back. °If you test positive and would like further evaluation for persistent or worsening symptoms, you may schedule an E-visit or virtual (video) visit throughout the [facility] app or website. ° °PLEASE NOTE: If you develop severe chest pain or shortness of breath please go to the ER or call 9-1-1 for further evaluation --> DO NOT schedule electronic or virtual visits for this. °Please call our office for further guidance / recommendations as needed. ° °For information about the Covid vaccine, please visit [email or web address] °

## 2019-10-06 LAB — CERVICOVAGINAL ANCILLARY ONLY
Chlamydia: NEGATIVE
Comment: NEGATIVE
Comment: NEGATIVE
Comment: NORMAL
Neisseria Gonorrhea: NEGATIVE
Trichomonas: NEGATIVE

## 2019-10-06 LAB — NOVEL CORONAVIRUS, NAA: SARS-CoV-2, NAA: DETECTED — AB

## 2019-10-06 LAB — SARS-COV-2, NAA 2 DAY TAT

## 2019-11-17 ENCOUNTER — Ambulatory Visit (INDEPENDENT_AMBULATORY_CARE_PROVIDER_SITE_OTHER): Payer: Medicaid Other

## 2019-11-17 ENCOUNTER — Other Ambulatory Visit: Payer: Self-pay

## 2019-11-17 DIAGNOSIS — Z3042 Encounter for surveillance of injectable contraceptive: Secondary | ICD-10-CM

## 2019-11-17 NOTE — Progress Notes (Signed)
Pt is in the office for depo injection, administered in LUOQ and pt tolerated well. Next due Aug 17-31 .Marland Kitchen Administrations This Visit    medroxyPROGESTERone (DEPO-PROVERA) injection 150 mg    Admin Date 11/17/2019 Action Given Dose 150 mg Route Intramuscular Administered By Katrina Stack, RN

## 2020-02-08 ENCOUNTER — Ambulatory Visit (INDEPENDENT_AMBULATORY_CARE_PROVIDER_SITE_OTHER): Payer: Medicaid Other

## 2020-02-08 ENCOUNTER — Other Ambulatory Visit: Payer: Self-pay

## 2020-02-08 VITALS — BP 116/68 | HR 106 | Ht 64.0 in | Wt 130.0 lb

## 2020-02-08 DIAGNOSIS — Z3042 Encounter for surveillance of injectable contraceptive: Secondary | ICD-10-CM

## 2020-02-08 MED ORDER — MEDROXYPROGESTERONE ACETATE 150 MG/ML IM SUSP
150.0000 mg | Freq: Once | INTRAMUSCULAR | Status: AC
  Administered 2020-02-08: 150 mg via INTRAMUSCULAR

## 2020-02-08 NOTE — Progress Notes (Signed)
GYN presents for DEPO injection, given in RUOQ, tolerated well.  Last PAP 02/25/2019, patient needs to schedule Annual Exam before next DEPO.  Next DEPO due Nov. 8-22, 2021  Administrations This Visit    medroxyPROGESTERone (DEPO-PROVERA) injection 150 mg    Admin Date 02/08/2020 Action Given Dose 150 mg Route Intramuscular Administered By Maretta Bees, RMA

## 2020-02-09 NOTE — Progress Notes (Signed)
Patient was assessed and managed by nursing staff during this encounter. I have reviewed the chart and agree with the documentation and plan. I have also made any necessary editorial changes.  Sharen Counter, CNM 02/09/2020 3:22 PM

## 2020-03-09 ENCOUNTER — Other Ambulatory Visit (HOSPITAL_COMMUNITY)
Admission: RE | Admit: 2020-03-09 | Discharge: 2020-03-09 | Disposition: A | Discharge status: Home / Self Care | Payer: Medicaid Other | Source: Ambulatory Visit | Attending: Obstetrics and Gynecology | Admitting: Obstetrics and Gynecology

## 2020-03-09 ENCOUNTER — Ambulatory Visit (INDEPENDENT_AMBULATORY_CARE_PROVIDER_SITE_OTHER): Payer: Medicaid Other | Admitting: Obstetrics and Gynecology

## 2020-03-09 ENCOUNTER — Encounter: Payer: Self-pay | Admitting: Obstetrics and Gynecology

## 2020-03-09 VITALS — BP 121/74 | HR 99 | Ht 64.0 in | Wt 136.0 lb

## 2020-03-09 DIAGNOSIS — Z01419 Encounter for gynecological examination (general) (routine) without abnormal findings: Secondary | ICD-10-CM | POA: Insufficient documentation

## 2020-03-09 DIAGNOSIS — Z23 Encounter for immunization: Secondary | ICD-10-CM | POA: Diagnosis not present

## 2020-03-09 DIAGNOSIS — Z113 Encounter for screening for infections with a predominantly sexual mode of transmission: Secondary | ICD-10-CM | POA: Diagnosis not present

## 2020-03-09 MED ORDER — MEDROXYPROGESTERONE ACETATE 150 MG/ML IM SUSP: 150.0000 mg | INTRAMUSCULAR | 4 refills | Status: DC

## 2020-03-09 NOTE — Progress Notes (Signed)
Pt presents for annual and all STD testing.  Last pap 02/25/2019 - ASCUS HPV detected

## 2020-03-09 NOTE — Progress Notes (Signed)
Subjective:     Stacey Shepard is a 29 y.o. female P3 with depo-provera induced amenorrhea and BMI 23 who is here for a comprehensive physical exam. The patient reports no problems. She is sexually active without complaints. She denies pelvic pain or abnormal discharge. She denies any urinary symptoms. She denies constipation.   Past Medical History:  Diagnosis Date  . Hx of chlamydia infection   . Medical history non-contributory    Past Surgical History:  Procedure Laterality Date  . DILATION AND CURETTAGE OF UTERUS N/A 01/09/2016   Procedure: DILATATION AND CURETTAGE;  Surgeon: Reva Bores, MD;  Location: [facility];  Service: Gynecology;  Laterality: N/A;  . NO PAST SURGERIES     Family History  Problem Relation Age of Onset  . Cancer Paternal Aunt        stomach  . Breast cancer Paternal Aunt     Social History   Socioeconomic History  . Marital status: Single    Spouse name: Not on file  . Number of children: Not on file  . Years of education: Not on file  . Highest education level: Not on file  Occupational History  . Not on file  Tobacco Use  . Smoking status: Never Smoker  . Smokeless tobacco: Never Used  Substance and Sexual Activity  . Alcohol use: No    Alcohol/week: 0.0 standard drinks  . Drug use: No  . Sexual activity: Yes    Partners: Male    Birth control/protection: Injection, None  Other Topics Concern  . Not on file  Social History Narrative  . Not on file   Social Determinants of Health   Financial Resource Strain:   . Difficulty of Paying Living Expenses: Not on file  Food Insecurity:   . Worried About Programme researcher, broadcasting/film/video in the Last Year: Not on file  . Ran Out of Food in the Last Year: Not on file  Transportation Needs:   . Lack of Transportation (Medical): Not on file  . Lack of Transportation (Non-Medical): Not on file  Physical Activity:   . Days of Exercise per Week: Not on file  . Minutes of Exercise per Session: Not on file   Stress:   . Feeling of Stress : Not on file  Social Connections:   . Frequency of Communication with Friends and Family: Not on file  . Frequency of Social Gatherings with Friends and Family: Not on file  . Attends Religious Services: Not on file  . Active Member of Clubs or Organizations: Not on file  . Attends Banker Meetings: Not on file  . Marital Status: Not on file  Intimate Partner Violence:   . Fear of Current or Ex-Partner: Not on file  . Emotionally Abused: Not on file  . Physically Abused: Not on file  . Sexually Abused: Not on file   Health Maintenance  Topic Date Due  . COVID-19 Vaccine (1) Never done  . INFLUENZA VACCINE  01/17/2020  . PAP SMEAR-Modifier  02/24/2022  . TETANUS/TDAP  12/28/2025  . Hepatitis C Screening  Completed  . HIV Screening  Completed       Review of Systems Pertinent items noted in HPI and remainder of comprehensive ROS otherwise negative.   Objective:  Blood pressure 121/74, pulse 99, height 5\' 4"  (1.626 m), weight 136 lb (61.7 kg).     GENERAL: Well-developed, well-nourished female in no acute distress.  HEENT: Normocephalic, atraumatic. Sclerae anicteric.  NECK: Supple. Normal  thyroid.  LUNGS: Clear to auscultation bilaterally.  HEART: Regular rate and rhythm. BREASTS: Symmetric in size. No palpable masses or lymphadenopathy, skin changes, or nipple drainage. ABDOMEN: Soft, nontender, nondistended. No organomegaly. PELVIC: Normal external female genitalia. Vagina is pink and rugated.  Normal discharge. Normal appearing cervix. Uterus is normal in size. No adnexal mass or tenderness. EXTREMITIES: No cyanosis, clubbing, or edema, 2+ distal pulses.    Assessment:    Healthy female exam.      Plan:    Pap smear collected STI testing per patient request Patient will be contacted with abnormal results Refill on depo-provera provided See After Visit Summary for Counseling Recommendations

## 2020-03-10 LAB — HEPATITIS C ANTIBODY: Hep C Virus Ab: <0.1 s/co ratio (ref 0.0–0.9)

## 2020-03-10 LAB — RPR: RPR Ser Ql: NONREACTIVE

## 2020-03-10 LAB — HEPATITIS B SURFACE ANTIGEN: Hepatitis B Surface Ag: NEGATIVE

## 2020-03-10 LAB — HIV ANTIBODY (ROUTINE TESTING W REFLEX): HIV Screen 4th Generation wRfx: NONREACTIVE

## 2020-03-11 DIAGNOSIS — Z7189 Other specified counseling: Secondary | ICD-10-CM | POA: Diagnosis not present

## 2020-03-11 DIAGNOSIS — Z1152 Encounter for screening for COVID-19: Secondary | ICD-10-CM | POA: Diagnosis not present

## 2020-03-11 DIAGNOSIS — Z20822 Contact with and (suspected) exposure to covid-19: Secondary | ICD-10-CM | POA: Diagnosis not present

## 2020-03-11 LAB — CERVICOVAGINAL ANCILLARY ONLY
Chlamydia: NEGATIVE
Comment: NEGATIVE
Comment: NEGATIVE
Comment: NORMAL
Neisseria Gonorrhea: NEGATIVE
Trichomonas: NEGATIVE

## 2020-03-14 LAB — CYTOLOGY - PAP
Comment: NEGATIVE
Diagnosis: NEGATIVE
High risk HPV: NEGATIVE

## 2020-03-19 DIAGNOSIS — Z20828 Contact with and (suspected) exposure to other viral communicable diseases: Secondary | ICD-10-CM | POA: Diagnosis not present

## 2020-04-29 ENCOUNTER — Ambulatory Visit: Payer: Medicaid Other

## 2020-09-16 ENCOUNTER — Ambulatory Visit: Payer: Medicaid Other

## 2021-01-10 ENCOUNTER — Other Ambulatory Visit: Payer: Self-pay

## 2021-01-10 ENCOUNTER — Other Ambulatory Visit (HOSPITAL_COMMUNITY)
Admission: RE | Admit: 2021-01-10 | Discharge: 2021-01-10 | Disposition: A | Discharge status: Home / Self Care | Payer: Medicaid Other | Source: Ambulatory Visit | Attending: Obstetrics & Gynecology | Admitting: Obstetrics & Gynecology

## 2021-01-10 ENCOUNTER — Ambulatory Visit (INDEPENDENT_AMBULATORY_CARE_PROVIDER_SITE_OTHER): Payer: Medicaid Other

## 2021-01-10 VITALS — BP 105/70 | HR 96 | Ht 64.0 in | Wt 130.0 lb

## 2021-01-10 DIAGNOSIS — N898 Other specified noninflammatory disorders of vagina: Secondary | ICD-10-CM | POA: Insufficient documentation

## 2021-01-10 NOTE — Progress Notes (Signed)
SUBJECTIVE:  30 y.o. female complains of thick vaginal discharge for 4 day(s). Denies abnormal vaginal bleeding or significant pelvic pain or fever. No UTI symptoms. Denies history of known exposure to STD.  No LMP recorded. Patient has had an injection.  OBJECTIVE:  She appears well, afebrile. Urine dipstick: not done.  ASSESSMENT:  Vaginal Discharge     PLAN:  GC, chlamydia, trichomonas, BVAG, CVAG probe sent to lab. Treatment: To be determined once lab results are received ROV prn if symptoms persist or worsen.

## 2021-01-11 LAB — CERVICOVAGINAL ANCILLARY ONLY
Bacterial Vaginitis (gardnerella): POSITIVE — AB
Candida Glabrata: NEGATIVE
Candida Vaginitis: POSITIVE — AB
Chlamydia: NEGATIVE
Comment: NEGATIVE
Comment: NEGATIVE
Comment: NEGATIVE
Comment: NEGATIVE
Comment: NEGATIVE
Comment: NORMAL
Neisseria Gonorrhea: NEGATIVE
Trichomonas: NEGATIVE

## 2021-01-12 ENCOUNTER — Other Ambulatory Visit (HOSPITAL_COMMUNITY): Payer: Self-pay | Admitting: Obstetrics & Gynecology

## 2021-01-12 DIAGNOSIS — B9689 Other specified bacterial agents as the cause of diseases classified elsewhere: Secondary | ICD-10-CM

## 2021-01-12 MED ORDER — METRONIDAZOLE 500 MG PO TABS: 500.0000 mg | ORAL_TABLET | Freq: Two times a day (BID) | ORAL | 0 refills | Status: AC

## 2021-01-12 MED ORDER — FLUCONAZOLE 150 MG PO TABS: 150.0000 mg | ORAL_TABLET | Freq: Once | ORAL | 0 refills | Status: AC

## 2021-01-12 NOTE — Progress Notes (Signed)
Meds ordered this encounter  Medications   metroNIDAZOLE (FLAGYL) 500 MG tablet    Sig: Take 1 tablet (500 mg total) by mouth 2 (two) times daily for 7 days.    Dispense:  14 tablet    Refill:  0   fluconazole (DIFLUCAN) 150 MG tablet    Sig: Take 1 tablet (150 mg total) by mouth once for 1 dose.    Dispense:  1 tablet    Refill:  0    

## 2021-04-08 ENCOUNTER — Emergency Department (HOSPITAL_BASED_OUTPATIENT_CLINIC_OR_DEPARTMENT_OTHER)
Admission: EM | Admit: 2021-04-08 | Discharge: 2021-04-08 | Disposition: A | Discharge status: Home / Self Care | Payer: Medicaid Other | Attending: Emergency Medicine | Admitting: Emergency Medicine

## 2021-04-08 ENCOUNTER — Other Ambulatory Visit: Payer: Self-pay

## 2021-04-08 ENCOUNTER — Encounter (HOSPITAL_BASED_OUTPATIENT_CLINIC_OR_DEPARTMENT_OTHER): Payer: Self-pay

## 2021-04-08 DIAGNOSIS — L298 Other pruritus: Secondary | ICD-10-CM | POA: Diagnosis not present

## 2021-04-08 DIAGNOSIS — W57XXXA Bitten or stung by nonvenomous insect and other nonvenomous arthropods, initial encounter: Secondary | ICD-10-CM | POA: Diagnosis not present

## 2021-04-08 DIAGNOSIS — S30861A Insect bite (nonvenomous) of abdominal wall, initial encounter: Secondary | ICD-10-CM | POA: Diagnosis not present

## 2021-04-08 DIAGNOSIS — Z5321 Procedure and treatment not carried out due to patient leaving prior to being seen by health care provider: Secondary | ICD-10-CM | POA: Diagnosis not present

## 2021-04-08 DIAGNOSIS — R42 Dizziness and giddiness: Secondary | ICD-10-CM | POA: Insufficient documentation

## 2021-04-08 NOTE — ED Triage Notes (Addendum)
Pt is present for a spider bite on her abdomen near her umbilicus that happened appx three days ago that has increased in swelling and is itchy. Pt states she went to [facility] to get things for her spider bite and "passed out" after she felt dizzy and lightheaded. Pt admits to drinking all day today.

## 2021-04-09 ENCOUNTER — Ambulatory Visit
Admission: EM | Admit: 2021-04-09 | Discharge: 2021-04-09 | Disposition: A | Discharge status: Home / Self Care | Payer: Medicaid Other | Attending: Internal Medicine | Admitting: Internal Medicine

## 2021-04-09 DIAGNOSIS — L739 Follicular disorder, unspecified: Secondary | ICD-10-CM | POA: Diagnosis not present

## 2021-04-09 MED ORDER — DOXYCYCLINE HYCLATE 100 MG PO CAPS: 100.0000 mg | ORAL_CAPSULE | Freq: Two times a day (BID) | ORAL | 0 refills | Status: DC

## 2021-04-09 NOTE — ED Triage Notes (Signed)
Three day h/o painful and pruritic bump on abdomen. Has been using A&D ointment and other OTC ointments without relief. Confirms swelling and hardness. Pt thinks the bump may be a bug bite.

## 2021-04-09 NOTE — ED Provider Notes (Signed)
[name]    CSN: [id number] Arrival date & time: 04/09/21  0955      History   Chief Complaint Chief Complaint  Patient presents with   bump on abdomen    HPI Stacey Shepard is a 30 y.o. female.   Patient presents with 3-day history of a painful bump that is present on the lower abdomen.  Denies any drainage from the bump.  Denies any fevers, chills, body aches.  Patient is concerned for insect bite but does not remember anything biting her.  Has been using over-the-counter anti-itch ointments with no improvement in symptoms.  Lump is slightly painful and itchy.    Past Medical History:  Diagnosis Date   Hx of chlamydia infection    Medical history non-contributory     Patient Active Problem List   Diagnosis Date Noted   ASCUS with positive high risk HPV cervical 03/30/2019   Gynecologic exam normal 09/02/2018   Possible exposure to STD 09/02/2018   Depo-Provera contraceptive status 12/24/2016    Past Surgical History:  Procedure Laterality Date   DILATION AND CURETTAGE OF UTERUS N/A 01/09/2016   Procedure: DILATATION AND CURETTAGE;  Surgeon: Reva Bores, MD;  Location: [facility];  Service: Gynecology;  Laterality: N/A;   NO PAST SURGERIES      OB History     Gravida  3   Para  3   Term  3   Preterm  0   AB  0   Living  3      SAB  0   IAB  0   Ectopic  0   Multiple  0   Live Births  3            Home Medications    Prior to Admission medications   Medication Sig Start Date End Date Taking? Authorizing Provider  doxycycline (VIBRAMYCIN) 100 MG capsule Take 1 capsule (100 mg total) by mouth 2 (two) times daily. 04/09/21  Yes Nancyjo Givhan, Acie Fredrickson, FNP  cetirizine (ZYRTEC ALLERGY) 10 MG tablet Take 1 tablet (10 mg total) by mouth daily. Patient not taking: Reported on 03/09/2020 10/05/19   Hall-Potvin, [place], PA-C  ferrous sulfate 325 (65 FE) MG tablet Take 1 tablet (325 mg total) by mouth 2 (two) times daily with a  meal. Patient not taking: Reported on 03/09/2020 01/10/16   Lorne Skeens, MD  fluticasone [facility]) 50 MCG/ACT nasal spray Place 1 spray into both nostrils daily. Patient not taking: Reported on 03/09/2020 10/05/19   Hall-Potvin, [place], PA-C  ibuprofen (ADVIL,MOTRIN) 600 MG tablet Take 1 tablet (600 mg total) by mouth every 6 (six) hours as needed (mild pain). Patient not taking: Reported on 10/28/2018 01/10/16   Lorne Skeens, MD  medroxyPROGESTERone (DEPO-PROVERA) 150 MG/ML injection Inject 1 mL (150 mg total) into the muscle every 3 (three) months. 03/09/20   Constant, Peggy, MD  senna-docusate (SENOKOT-S) 8.6-50 MG tablet Take 2 tablets by mouth 2 (two) times daily. Patient not taking: Reported on 10/28/2018 12/30/15   Orvilla Cornwall A, CNM  norethindrone-ethinyl estradiol 1/35 (ORTHO-NOVUM 1/35, 28,) tablet Take 1 tablet by mouth daily. 03/04/19 10/05/19  Constant, Peggy, MD    Family History Family History  Problem Relation Age of Onset   Cancer Paternal Aunt        stomach   Breast cancer Paternal Aunt     Social History Social History   Tobacco Use   Smoking status: Never   Smokeless tobacco: Never  Substance Use Topics   Alcohol use: Yes   Drug use: No     Allergies   Patient has no known allergies.   Review of Systems Review of Systems Per HPI  Physical Exam Triage Vital Signs ED Triage Vitals  Enc Vitals Group     BP 04/09/21 1033 101/63     Pulse Rate 04/09/21 1033 86     Resp 04/09/21 1033 14     Temp 04/09/21 1033 98.1 F (36.7 C)     Temp Source 04/09/21 1033 Oral     SpO2 04/09/21 1033 98 %     Weight --      Height --      Head Circumference --      Peak Flow --      Pain Score 04/09/21 1055 10     Pain Loc --      Pain Edu? --      Excl. in GC? --    No data found.  Updated Vital Signs BP 101/63 (BP Location: Left Arm)   Pulse 86   Temp 98.1 F (36.7 C) (Oral)   Resp 14   SpO2 98%   Visual Acuity Right Eye  Distance:   Left Eye Distance:   Bilateral Distance:    Right Eye Near:   Left Eye Near:    Bilateral Near:     Physical Exam Constitutional:      General: She is not in acute distress.    Appearance: Normal appearance. She is not toxic-appearing or diaphoretic.  HENT:     Head: Normocephalic and atraumatic.  Eyes:     Extraocular Movements: Extraocular movements intact.     Conjunctiva/sclera: Conjunctivae normal.  Pulmonary:     Effort: Pulmonary effort is normal.  Skin:    General: Skin is warm and dry.     Findings: Abscess present.     Comments: Patient has approximately 2 to 3 cm in diameter indurated abscess present to right lower to mid abdomen that is consistent with hair follicles.  No drainage noted.  Neurological:     General: No focal deficit present.     Mental Status: She is alert and oriented to person, place, and time. Mental status is at baseline.  Psychiatric:        Mood and Affect: Mood normal.        Behavior: Behavior normal.        Thought Content: Thought content normal.        Judgment: Judgment normal.     UC Treatments / Results  Labs (all labs ordered are listed, but only abnormal results are displayed) Labs Reviewed - No data to display  EKG   Radiology No results found.  Procedures Procedures (including critical care time)  Medications Ordered in UC Medications - No data to display  Initial Impression / Assessment and Plan / UC Course  I have reviewed the triage vital signs and the nursing notes.  Pertinent labs & imaging results that were available during my care of the patient were reviewed by me and considered in my medical decision making (see chart for details).     Abscess is consistent with hair follicles.  Suspect folliculitis.  Will treat with doxycycline antibiotic as patient is not concerned for pregnancy and is not currently pregnant.  Warm compresses as well.  No I&D at this time as abscess is indurated and not able  to be drained.Discussed strict return precautions. Patient verbalized understanding and is  agreeable with plan.  Final Clinical Impressions(s) / UC Diagnoses   Final diagnoses:  Folliculitis     Discharge Instructions      You have an infected hair follicle with an abscess.  You are being treated with doxycycline antibiotic.  Please use warm compresses to affected area as well.     ED Prescriptions     Medication Sig Dispense Auth. Provider   doxycycline (VIBRAMYCIN) 100 MG capsule Take 1 capsule (100 mg total) by mouth 2 (two) times daily. 20 capsule Gustavus Bryant, [place]      PDMP not reviewed this encounter.   Gustavus Bryant, [place] 04/09/21 1159

## 2021-04-09 NOTE — Discharge Instructions (Signed)
You have an infected hair follicle with an abscess.  You are being treated with doxycycline antibiotic.  Please use warm compresses to affected area as well.

## 2021-04-10 ENCOUNTER — Telehealth: Payer: Self-pay

## 2021-04-10 NOTE — Telephone Encounter (Signed)
Transition Care Management Unsuccessful Follow-up Telephone Call  Date of discharge and from where:  04/08/2021-[facility]  Attempts:  1st Attempt  Reason for unsuccessful TCM follow-up call:  Left voice message

## 2021-04-11 NOTE — Telephone Encounter (Signed)
Transition Care Management Unsuccessful Follow-up Telephone Call  Date of discharge and from where:  04/08/2021-[facility]  Attempts:  2nd Attempt  Reason for unsuccessful TCM follow-up call:  Unable to reach patient

## 2021-04-12 NOTE — Telephone Encounter (Signed)
Transition Care Management Unsuccessful Follow-up Telephone Call  Date of discharge and from where:  04/08/2021-[facility]  Attempts:  3rd Attempt  Reason for unsuccessful TCM follow-up call:  Left voice message

## 2022-01-02 ENCOUNTER — Encounter: Payer: Medicaid Other | Admitting: Obstetrics

## 2022-01-04 ENCOUNTER — Ambulatory Visit: Payer: Medicaid Other | Admitting: Student

## 2022-01-24 NOTE — Progress Notes (Signed)
Patient had to cancel appointment.

## 2022-03-02 ENCOUNTER — Ambulatory Visit
Admission: EM | Admit: 2022-03-02 | Discharge: 2022-03-02 | Disposition: A | Discharge status: Home / Self Care | Payer: Medicaid Other | Attending: Urgent Care | Admitting: Urgent Care

## 2022-03-02 ENCOUNTER — Ambulatory Visit (INDEPENDENT_AMBULATORY_CARE_PROVIDER_SITE_OTHER): Payer: Medicaid Other

## 2022-03-02 ENCOUNTER — Encounter: Payer: Self-pay | Admitting: Emergency Medicine

## 2022-03-02 DIAGNOSIS — M25512 Pain in left shoulder: Secondary | ICD-10-CM | POA: Diagnosis not present

## 2022-03-02 DIAGNOSIS — S46912A Strain of unspecified muscle, fascia and tendon at shoulder and upper arm level, left arm, initial encounter: Secondary | ICD-10-CM

## 2022-03-02 MED ORDER — NAPROXEN 500 MG PO TABS: 500.0000 mg | ORAL_TABLET | Freq: Two times a day (BID) | ORAL | 0 refills | Status: DC

## 2022-03-02 NOTE — ED Triage Notes (Signed)
Triaged by provider  

## 2022-03-02 NOTE — ED Provider Notes (Signed)
[facility]  Note:  This document was prepared using Conservation officer, historic buildings and may include unintentional dictation errors.  MRN: [id number] DOB: Jul 03, 1990  Subjective:   Stacey Shepard is a 31 y.o. female presenting for 2 to 3-day history of acute onset persistent left shoulder pain.  Symptoms started while she was working at [employer], uses free [employer].  She felt mild pain and decided to manage supportively.  As she went to sleep she went to sleep on the left side and then woke up with significant pain, decreased range of motion of the left shoulder.  No bruising, swelling, fall, trauma.   Current Facility-Administered Medications:    medroxyPROGESTERone (DEPO-PROVERA) injection 150 mg, 150 mg, Intramuscular, Q90 days, Harraway-Smith, Eber Jones, MD, 150 mg at 11/17/19 1046  Current Outpatient Medications:    cetirizine (ZYRTEC ALLERGY) 10 MG tablet, Take 1 tablet (10 mg total) by mouth daily. (Patient not taking: Reported on 03/09/2020), Disp: 30 tablet, Rfl: 0   doxycycline (VIBRAMYCIN) 100 MG capsule, Take 1 capsule (100 mg total) by mouth 2 (two) times daily., Disp: 20 capsule, Rfl: 0   ferrous sulfate 325 (65 FE) MG tablet, Take 1 tablet (325 mg total) by mouth 2 (two) times daily with a meal. (Patient not taking: Reported on 03/09/2020), Disp: 60 tablet, Rfl: 3   fluticasone (FLONASE) 50 MCG/ACT nasal spray, Place 1 spray into both nostrils daily. (Patient not taking: Reported on 03/09/2020), Disp: 16 g, Rfl: 0   ibuprofen (ADVIL,MOTRIN) 600 MG tablet, Take 1 tablet (600 mg total) by mouth every 6 (six) hours as needed (mild pain). (Patient not taking: Reported on 10/28/2018), Disp: 30 tablet, Rfl: 0   medroxyPROGESTERone (DEPO-PROVERA) 150 MG/ML injection, Inject 1 mL (150 mg total) into the muscle every 3 (three) months., Disp: 1 mL, Rfl: 4   senna-docusate (SENOKOT-S) 8.6-50 MG tablet, Take 2 tablets by mouth 2 (two) times daily. (Patient not taking:  Reported on 10/28/2018), Disp: 90 tablet, Rfl: PRN   No Known Allergies  Past Medical History:  Diagnosis Date   Hx of chlamydia infection    Medical history non-contributory      Past Surgical History:  Procedure Laterality Date   DILATION AND CURETTAGE OF UTERUS N/A 01/09/2016   Procedure: DILATATION AND CURETTAGE;  Surgeon: Reva Bores, MD;  Location: [facility];  Service: Gynecology;  Laterality: N/A;   NO PAST SURGERIES      Family History  Problem Relation Age of Onset   Cancer Paternal Aunt        stomach   Breast cancer Paternal Aunt     Social History   Tobacco Use   Smoking status: Never   Smokeless tobacco: Never  Substance Use Topics   Alcohol use: Yes   Drug use: No    ROS   Objective:   Vitals: BP 106/66   Pulse 94   Temp 98.6 F (37 C)   Resp 20   SpO2 98%   Physical Exam Constitutional:      General: She is not in acute distress.    Appearance: Normal appearance. She is well-developed. She is not ill-appearing, toxic-appearing or diaphoretic.  HENT:     Head: Normocephalic and atraumatic.     Nose: Nose normal.     Mouth/Throat:     Mouth: Mucous membranes are moist.  Eyes:     General: No scleral icterus.       Right eye: No discharge.  Left eye: No discharge.     Extraocular Movements: Extraocular movements intact.  Cardiovascular:     Rate and Rhythm: Normal rate.  Pulmonary:     Effort: Pulmonary effort is normal.  Musculoskeletal:     Left shoulder: Tenderness (throughout) present. No swelling, deformity, effusion, laceration, bony tenderness or crepitus. Decreased range of motion. Normal strength.  Skin:    General: Skin is warm and dry.  Neurological:     General: No focal deficit present.     Mental Status: She is alert and oriented to person, place, and time.  Psychiatric:        Mood and Affect: Mood normal.        Behavior: Behavior normal.     DG Shoulder Left  Result Date: 03/02/2022 CLINICAL DATA:  LEFT  shoulder pain since waking up on Tuesday, no known injury EXAM: LEFT SHOULDER - 2+ VIEW COMPARISON:  None FINDINGS: Osseous mineralization normal. AC joint alignment normal. Visualized ribs intact. No fracture, dislocation, or bone destruction. IMPRESSION: Normal exam. Electronically Signed   By: Ulyses Southward M.D.   On: 03/02/2022 12:57     Assessment and Plan :   PDMP not reviewed this encounter.  1. Acute pain of left shoulder   2. Shoulder strain, left, initial encounter    We will manage for a left shoulder strain with supportive care, resting, icing, naproxen for pain and inflammation.  Follow-up with an orthopedist should symptoms persist.  Counseled patient on potential for adverse effects with medications prescribed/recommended today, ER and return-to-clinic precautions discussed, patient verbalized understanding.    Wallis Bamberg, [place] 03/02/22 1616

## 2022-04-09 ENCOUNTER — Ambulatory Visit: Payer: Medicaid Other | Admitting: Obstetrics and Gynecology

## 2022-06-08 ENCOUNTER — Encounter: Payer: Self-pay | Admitting: Obstetrics and Gynecology

## 2022-06-08 ENCOUNTER — Other Ambulatory Visit (HOSPITAL_COMMUNITY)
Admission: RE | Admit: 2022-06-08 | Discharge: 2022-06-08 | Disposition: A | Discharge status: Home / Self Care | Payer: Medicaid Other | Source: Ambulatory Visit | Attending: Student | Admitting: Student

## 2022-06-08 ENCOUNTER — Ambulatory Visit (INDEPENDENT_AMBULATORY_CARE_PROVIDER_SITE_OTHER): Payer: Medicaid Other | Admitting: Obstetrics and Gynecology

## 2022-06-08 VITALS — BP 99/67 | HR 87 | Ht 64.0 in | Wt 128.5 lb

## 2022-06-08 DIAGNOSIS — Z01419 Encounter for gynecological examination (general) (routine) without abnormal findings: Secondary | ICD-10-CM | POA: Diagnosis not present

## 2022-06-08 DIAGNOSIS — Z113 Encounter for screening for infections with a predominantly sexual mode of transmission: Secondary | ICD-10-CM | POA: Diagnosis not present

## 2022-06-08 DIAGNOSIS — R87810 Cervical high risk human papillomavirus (HPV) DNA test positive: Secondary | ICD-10-CM

## 2022-06-08 DIAGNOSIS — R87610 Atypical squamous cells of undetermined significance on cytologic smear of cervix (ASC-US): Secondary | ICD-10-CM

## 2022-06-08 DIAGNOSIS — Z23 Encounter for immunization: Secondary | ICD-10-CM | POA: Diagnosis not present

## 2022-06-08 LAB — POCT URINE PREGNANCY: Preg Test, Ur: NEGATIVE

## 2022-06-08 MED ORDER — VITAFOL GUMMIES 3.33-0.333-34.8 MG PO CHEW: 1.0000 | CHEWABLE_TABLET | Freq: Every day | ORAL | 5 refills | Status: AC

## 2022-06-08 NOTE — Progress Notes (Signed)
Pt presents for AEX and std testing. Pt requesting UPT. States last period ws the end of October.

## 2022-06-08 NOTE — Patient Instructions (Signed)
The best days to conceive are the 1-2 days before you ovulate and the day you ovulate.  You can predict the day of ovulation in 4 ways: Tracking with a calendar. This works best if you have extremely regular menstrual cycles. Tracking changes to cervical mucus. You are most likely to conceive when cervical mucus is clear and slippery. Tracking your body temperature. This is less helpful because your body temperature doesn't rise until AFTER you ovulated (too late to time intercourse) It is ideal to use a special basal body temperature thermometer since it can pick up subtle differences. You take your temperature first thing in the morning - before you get out of bed, use the bathroom, or eat/drink anything. You will need to chart your temperature every day to be able to pick up on a meaningful rise. Tracking with ovulation predictor kits. These are urine tests that you do at home. They pick up a hormone in your urine called "LH" which spikes right before you ovulate. You should time intercourse to the day of the LH spike. You want to start testing in the 5 days before you think you'll ovulate, but the kits usually come with instructions that explain when to use them.  If you are trying to get pregnant, take a prenatal vitamin every day. They work best to help your baby's brain & spinal cord growth if you're taking them BEFORE you get pregnant.  

## 2022-06-08 NOTE — Progress Notes (Signed)
ANNUAL EXAM Patient name: Stacey Shepard MRN [id number]  Date of birth: Jun 15, 1991 Chief Complaint:   Annual exam  History of Present Illness:   Stacey Shepard is a 31 y.o. [phone] with LMP 04/11/22 (approx) being seen today for a routine annual exam.   Current complaints: Trying to conceive x 2 years. No timed intercourse. Desires STI screening   Upstream - 06/08/22 [id number]       Pregnancy Intention Screening   Does the patient want to become pregnant in the next year? Yes    Does the patient's partner want to become pregnant in the next year? Yes    Would the patient like to discuss contraceptive options today? No            The pregnancy intention screening data noted above was reviewed. Potential methods of contraception were discussed. The patient elected to proceed with no contraception  Last pap 02/2020. Results were: NILM w/ HRHPV negative. 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 Next pap due 02/2023 Last mammogram: n/a. Family h/o breast cancer: yes paternal aunt Last colonoscopy: n/a. Family h/o colorectal cancer: no HPV vaccine: never     06/08/2022    8:29 AM  Depression screen PHQ 2/9  Decreased Interest 0  Down, Depressed, Hopeless 0  PHQ - 2 Score 0  Altered sleeping 0  Tired, decreased energy 0  Change in appetite 0  Feeling bad or failure about yourself  0  Trouble concentrating 0  Moving slowly or fidgety/restless 0  Suicidal thoughts 0  PHQ-9 Score 0        06/08/2022    8:29 AM  GAD 7 : Generalized Anxiety Score  Nervous, Anxious, on Edge 0  Control/stop worrying 0  Worry too much - different things 0  Trouble relaxing 0  Restless 0  Easily annoyed or irritable 0  Afraid - awful might happen 0  Total GAD 7 Score 0   Review of Systems:   Pertinent items are noted in HPI Denies any headaches, blurred vision, fatigue, shortness of breath, chest pain, abdominal pain, abnormal vaginal discharge/itching/odor/irritation, problems  with periods, bowel movements, urination, or intercourse unless otherwise stated above. Pertinent History Reviewed:  Reviewed past medical,surgical, social and family history.  Reviewed problem list, medications and allergies. Physical Assessment:   Vitals:   06/08/22 0825  BP: 99/67  Pulse: 87  Weight: 128 lb 8 oz (58.3 kg)  Height: 5\' 4"  (1.626 m)  Body mass index is 22.06 kg/m.        Physical Examination:   General appearance - well appearing, and in no distress  Mental status - alert, oriented to person, place, and time  Chest - respiratory effort normal  Heart - normal peripheral perfusion  Breasts - breasts appear normal, no suspicious masses, no skin or nipple changes or axillary nodes  Abdomen - soft, nontender, nondistended, no masses or organomegaly  Pelvic - VULVA: normal appearing vulva with no masses, tenderness or lesions  VAGINA: normal appearing vagina with normal color and discharge, no lesions  CERVIX: normal appearing cervix without discharge or lesions, no CMT  UTERUS: uterus is felt to be normal size, shape, consistency and nontender   ADNEXA: No adnexal masses or tenderness noted.  Chaperone present for exam  Results for orders placed or performed in visit on 06/08/22 (from the past 24 hour(s))  POCT urine pregnancy   Collection Time: 06/08/22  9:34 AM  Result Value Ref Range  Preg Test, Ur Negative Negative    Assessment & Plan:  1) Well-Woman Exam Mammogram: @ 32yo, or sooner if problems Colonoscopy: @ 32yo, or sooner if problems Pap: Next due 02/2023 GC/CT: Collected HIV/HCV: Ordered Flu shot today Gardasil today  2) Desires pregnancy PNV Discussed timed intercourse (see AVS for counseling details) Will RTC in 3-28mo if has not conceived with timed intercourse for further evaluation  Labs/procedures today:  Orders Placed This Encounter  Procedures   HPV 9-valent vaccine,Recombinat   Flu Vaccine QUAD 36+ mos IM (Fluarix, Quad PF)   HIV  antibody (with reflex)   RPR   Hepatitis B Surface AntiGEN   Hepatitis C Antibody   POCT urine pregnancy   Meds:  Meds ordered this encounter  Medications   Prenatal Vit-Fe Phos-FA-Omega (VITAFOL GUMMIES) 3.33-0.333-34.8 MG CHEW    Sig: Chew 1 tablet by mouth daily.    Dispense:  90 tablet    Refill:  5   Follow-up: Return in about 1 year (around 06/09/2023).  Lennart Pall, MD 06/08/2022 9:36 AM

## 2022-06-12 LAB — CERVICOVAGINAL ANCILLARY ONLY
Chlamydia: NEGATIVE
Comment: NEGATIVE
Comment: NEGATIVE
Comment: NORMAL
Neisseria Gonorrhea: NEGATIVE
Trichomonas: POSITIVE — AB

## 2022-06-13 MED ORDER — METRONIDAZOLE 500 MG PO TABS: 500.0000 mg | ORAL_TABLET | Freq: Two times a day (BID) | ORAL | 0 refills | Status: AC

## 2022-06-13 NOTE — Addendum Note (Signed)
Addended by: Harvie Bridge on: 06/13/2022 06:27 PM   Modules accepted: Orders

## 2022-07-08 ENCOUNTER — Ambulatory Visit (HOSPITAL_COMMUNITY)
Admission: EM | Admit: 2022-07-08 | Discharge: 2022-07-08 | Disposition: A | Discharge status: Home / Self Care | Payer: Medicaid Other | Attending: Family Medicine | Admitting: Family Medicine

## 2022-07-08 ENCOUNTER — Encounter (HOSPITAL_COMMUNITY): Payer: Self-pay

## 2022-07-08 DIAGNOSIS — T148XXA Other injury of unspecified body region, initial encounter: Secondary | ICD-10-CM | POA: Diagnosis not present

## 2022-07-08 MED ORDER — KETOROLAC TROMETHAMINE 30 MG/ML IJ SOLN
30.0000 mg | Freq: Once | INTRAMUSCULAR | Status: AC
  Administered 2022-07-08: 30 mg via INTRAMUSCULAR

## 2022-07-08 MED ORDER — KETOROLAC TROMETHAMINE 30 MG/ML IJ SOLN
INTRAMUSCULAR | Status: AC
  Filled 2022-07-08: qty 1

## 2022-07-08 MED ORDER — CYCLOBENZAPRINE HCL 5 MG PO TABS: 5.0000 mg | ORAL_TABLET | Freq: Three times a day (TID) | ORAL | 0 refills | Status: AC

## 2022-07-08 NOTE — Discharge Instructions (Addendum)
You were seen today for back pain.  This appears to be muscular in nature.  I have given you a shot of toradol today.  I have sent a muscle relaxer to your pharmacy.  This may make you tired/sleepy so please take when home and not driving.  You may also use motrin for pain.  I recommend a heating pad as well.  Please return or follow up with your primary care provider if not improving.

## 2022-07-08 NOTE — ED Provider Notes (Signed)
[place]    CSN: [id number] Arrival date & time: 07/08/22  1008      History   Chief Complaint Chief Complaint  Patient presents with   Back Pain    HPI Stacey Shepard is a 32 y.o. female.   When she woke this morning she woke with pain at the right upper back, at the right shoulder blade.  She could not really move the morning due to pain.  Movement makes this worse.  She has not taken anything for pain.  No fevers/chills.  She did not do anything out of the ordinary yesterday, no heavy lifting.  No numbness/tingling.  No n/v.  No urinary symptoms.  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        Past Medical History:  Diagnosis Date   Hx of chlamydia infection    Medical history non-contributory     Patient Active Problem List   Diagnosis Date Noted   ASCUS with positive high risk HPV cervical 03/30/2019    Past Surgical History:  Procedure Laterality Date   DILATION AND CURETTAGE OF UTERUS N/A 01/09/2016   Procedure: DILATATION AND CURETTAGE;  Surgeon: Donnamae Jude, MD;  Location: [facility];  Service: Gynecology;  Laterality: N/A;   NO PAST SURGERIES      OB History     Gravida  3   Para  3   Term  3   Preterm  0   AB  0   Living  3      SAB  0   IAB  0   Ectopic  0   Multiple  0   Live Births  3            Home Medications    Prior to Admission medications   Medication Sig Start Date End Date Taking? Authorizing Provider  Prenatal Vit-Fe Phos-FA-Omega (VITAFOL GUMMIES) 3.33-0.333-34.8 MG CHEW Chew 1 tablet by mouth daily. 06/08/22  Yes Inez Catalina, MD  norethindrone-ethinyl estradiol 1/35 ([place] 1/35, 28,) tablet Take 1 tablet by mouth daily. 03/04/19 10/05/19  Constant, Peggy, MD    Family History Family History  Problem Relation Age of Onset   Cancer Paternal Aunt        stomach   Breast cancer Paternal Aunt     Social History Social  History   Tobacco Use   Smoking status: Never   Smokeless tobacco: Never  Vaping Use   Vaping Use: Never used  Substance Use Topics   Alcohol use: Not Currently   Drug use: No     Allergies   Patient has no known allergies.   Review of Systems Review of Systems  Constitutional: Negative.   HENT: Negative.    Respiratory: Negative.    Cardiovascular: Negative.   Gastrointestinal: Negative.   Musculoskeletal:  Positive for back pain.  Psychiatric/Behavioral: Negative.       Physical Exam Triage Vital Signs ED Triage Vitals  Enc Vitals Group     BP 07/08/22 1054 104/69     Pulse Rate 07/08/22 1054 81     Resp 07/08/22 1054 18     Temp 07/08/22 1054 98.4 F (36.9 C)     Temp Source 07/08/22 1054 Oral     SpO2 07/08/22 1054 100 %     Weight 07/08/22 1052 128 lb (58.1 kg)     Height --      Head  Circumference --      Peak Flow --      Pain Score 07/08/22 1052 10     Pain Loc --      Pain Edu? --      Excl. in [place]? --    No data found.  Updated Vital Signs BP 104/69 (BP Location: Left Arm)   Pulse 81   Temp 98.4 F (36.9 C) (Oral)   Resp 18   Wt 58.1 kg   LMP 06/21/2022 (Approximate)   SpO2 100%   BMI 21.97 kg/m   Visual Acuity Right Eye Distance:   Left Eye Distance:   Bilateral Distance:    Right Eye Near:   Left Eye Near:    Bilateral Near:     Physical Exam Constitutional:      Appearance: Normal appearance.  Cardiovascular:     Rate and Rhythm: Normal rate and regular rhythm.  Pulmonary:     Effort: Pulmonary effort is normal.     Breath sounds: Normal breath sounds.  Musculoskeletal:     Comments: No spinous tenderness;  + TTP to the mid back between the spine and the right shoulder blade;  full rom of the right arm with minimal pain  Skin:    General: Skin is warm.     Findings: No rash.  Neurological:     General: No focal deficit present.     Mental Status: She is alert.  Psychiatric:        Mood and Affect: Mood normal.       UC Treatments / Results  Labs (all labs ordered are listed, but only abnormal results are displayed) Labs Reviewed - No data to display  EKG   Radiology No results found.  Procedures Procedures (including critical care time)  Medications Ordered in UC Medications  ketorolac (TORADOL) 30 MG/ML injection 30 mg (has no administration in time range)    Initial Impression / Assessment and Plan / UC Course  I have reviewed the triage vital signs and the nursing notes.  Pertinent labs & imaging results that were available during my care of the patient were reviewed by me and considered in my medical decision making (see chart for details).     Final Clinical Impressions(s) / UC Diagnoses   Final diagnoses:  Muscle strain     Discharge Instructions      You were seen today for back pain.  This appears to be muscular in nature.  I have given you a shot of toradol today.  I have sent a muscle relaxer to your pharmacy.  This may make you tired/sleepy so please take when home and not driving.  You may also use motrin for pain.  I recommend a heating pad as well.  Please return or follow up with your primary care provider if not improving.     ED Prescriptions     Medication Sig Dispense Auth. Provider   cyclobenzaprine (FLEXERIL) 5 MG tablet Take 1 tablet (5 mg total) by mouth 3 (three) times daily. 21 tablet Rondel Oh, MD      PDMP not reviewed this encounter.   Rondel Oh, MD 07/08/22 [phone]

## 2022-07-08 NOTE — ED Triage Notes (Signed)
Pt reports right side back pain under shoulder blade. Happen today. Hasn't taken anything for pain.

## 2023-10-11 ENCOUNTER — Encounter

## 2023-12-03 DIAGNOSIS — S76311A Strain of muscle, fascia and tendon of the posterior muscle group at thigh level, right thigh, initial encounter: Secondary | ICD-10-CM | POA: Diagnosis not present

## 2023-12-03 DIAGNOSIS — S76301A Unspecified injury of muscle, fascia and tendon of the posterior muscle group at thigh level, right thigh, initial encounter: Secondary | ICD-10-CM | POA: Diagnosis not present

## 2023-12-07 ENCOUNTER — Emergency Department (HOSPITAL_BASED_OUTPATIENT_CLINIC_OR_DEPARTMENT_OTHER)
Admission: EM | Admit: 2023-12-07 | Discharge: 2023-12-07 | Disposition: A | Discharge status: Home / Self Care | Attending: Emergency Medicine | Admitting: Emergency Medicine

## 2023-12-07 ENCOUNTER — Encounter (HOSPITAL_BASED_OUTPATIENT_CLINIC_OR_DEPARTMENT_OTHER): Payer: Self-pay | Admitting: *Deleted

## 2023-12-07 ENCOUNTER — Other Ambulatory Visit: Payer: Self-pay

## 2023-12-07 DIAGNOSIS — Z79899 Other long term (current) drug therapy: Secondary | ICD-10-CM | POA: Insufficient documentation

## 2023-12-07 DIAGNOSIS — L02411 Cutaneous abscess of right axilla: Secondary | ICD-10-CM | POA: Diagnosis not present

## 2023-12-07 MED ORDER — DOXYCYCLINE HYCLATE 100 MG PO TABS
100.0000 mg | ORAL_TABLET | Freq: Once | ORAL | Status: AC
  Administered 2023-12-07: 100 mg via ORAL
  Filled 2023-12-07: qty 1

## 2023-12-07 MED ORDER — IBUPROFEN 400 MG PO TABS
600.0000 mg | ORAL_TABLET | Freq: Once | ORAL | Status: AC
  Administered 2023-12-07: 600 mg via ORAL
  Filled 2023-12-07: qty 1

## 2023-12-07 MED ORDER — DOXYCYCLINE HYCLATE 100 MG PO CAPS: 100.0000 mg | ORAL_CAPSULE | Freq: Two times a day (BID) | ORAL | 0 refills | Status: AC

## 2023-12-07 MED ORDER — LIDOCAINE HCL (PF) 1 % IJ SOLN
10.0000 mL | Freq: Once | INTRAMUSCULAR | Status: AC
  Administered 2023-12-07: 10 mL
  Filled 2023-12-07: qty 10

## 2023-12-07 NOTE — Discharge Instructions (Addendum)
 You had your abscess drained here today.  There is a small laceration (cut) where it was drained.  This will heal on its own. You may gently clean the area around your laceration as needed with soap and water.   Do not submerge your laceration in water (no baths, swimming) until it is fully healed (about 5 days). You may shower.   Please allow the area to keep draining if it is still doing so.  Please apply warm compresses to the area 2-3 times a day for 10 minutes at a time to help promote drainage.  Take 600mg  ibuprofen , then 3 hours later take 1000mg  tylenol , then 3 hours later 600mg  ibuprofen , then 3 hours later 1000mg  tylenol , so on and so forth for pain control.   You have been prescribed an antibiotic called doxycyline. Take this antibiotic 2 times a day for the next 5 days. Take the full course of your antibiotic even if you start feeling better. Antibiotics may cause you to have diarrhea.  You were given your first dose here today.  You may take your next dose tonight.   Return to the ER should you develop fever, chills, redness around your wound, any other new or concerning symptoms

## 2023-12-07 NOTE — ED Triage Notes (Signed)
 Abscess under right arm x 3 days. No drainage at home. No fevers.

## 2023-12-07 NOTE — ED Provider Notes (Signed)
 [place] [facility]MENT AT [facility] Provider Note   CSN: [id number] Arrival date & time: 12/07/23  1036     Patient presents with: Abscess   Stacey Shepard is a 33 y.o. female with no significant past medical history presents with concern for a painful bump on her right armpit for the last 3 days.  States it has gotten very swollen, but has not drained.  Denies any fever or chills.  Denies anything like this previously    Abscess      Prior to Admission medications   Medication Sig Start Date End Date Taking? Authorizing Provider  doxycycline  (VIBRAMYCIN ) 100 MG capsule Take 1 capsule (100 mg total) by mouth 2 (two) times daily for 5 days. 12/07/23 12/12/23 Yes Veta Palma, PA-C  cyclobenzaprine  (FLEXERIL ) 5 MG tablet Take 1 tablet (5 mg total) by mouth 3 (three) times daily. 07/08/22   Piontek, Rocky, MD  Prenatal Vit-Fe Phos-FA-Omega (VITAFOL  GUMMIES) 3.33-0.333-34.8 MG CHEW Chew 1 tablet by mouth daily. 06/08/22   Erik Kieth BROCKS, MD  norethindrone-ethinyl estradiol 1/35 (ORTHO-NOVUM  1/35, 28,) tablet Take 1 tablet by mouth daily. 03/04/19 10/05/19  Constant, Peggy, MD    Allergies: Patient has no known allergies.    Review of Systems  Skin:  Positive for color change.    Updated Vital Signs BP 106/68   Pulse 64   Temp 98.7 F (37.1 C) (Oral)   Resp 18   LMP 12/02/2023   SpO2 100%   Physical Exam Vitals and nursing note reviewed.  Constitutional:      Appearance: Normal appearance.  HENT:     Head: Atraumatic.   Cardiovascular:     Rate and Rhythm: Normal rate and regular rhythm.  Pulmonary:     Effort: Pulmonary effort is normal.   Musculoskeletal:        General: Normal range of motion.     Comments: No edema or erythema of the right shoulder diffusely.  Full range of motion of the bilateral upper extremities   Skin:    Comments: Raised indurated area of the right upper armpit, no definitive areas of fluctuance noted.  Approximately 2.5 cm in size.  Not currently draining.  Mild overlying erythema.   Neurological:     General: No focal deficit present.     Mental Status: She is alert.   Psychiatric:        Mood and Affect: Mood normal.        Behavior: Behavior normal.     (all labs ordered are listed, but only abnormal results are displayed) Labs Reviewed - No data to display  EKG: None  Radiology: No results found.   SABRAUltrasound ED Soft Tissue  Date/Time: 12/07/2023 2:38 PM  Performed by: Veta Palma, PA-C Authorized by: Veta Palma, PA-C   Procedure details:    Indications: localization of abscess and evaluate for cellulitis     Transverse view:  Visualized Location:    Location: axilla     Side:  Right Findings:     abscess present    cellulitis present Comments:     Hypoechoic area present consistent with abscess.  Also cobblestoning noted consistent with cellulitis and soft tissue edema .Incision and Drainage  Date/Time: 12/07/2023 2:38 PM  Performed by: Veta Palma, PA-C Authorized by: Veta Palma, PA-C   Consent:    Consent obtained:  Verbal   Consent given by:  Patient   Risks discussed:  Bleeding, incomplete drainage, pain and infection   Alternatives discussed:  No treatment and alternative treatment Universal protocol:    Patient identity confirmed:  Verbally with patient Location:    Type:  Abscess   Size:  2.5cm   Location: Right axilla. Pre-procedure details:    Skin preparation:  Povidone-iodine Sedation:    Sedation type:  None Anesthesia:    Anesthesia method:  Local infiltration   Local anesthetic:  Lidocaine  1% w/o epi (2ml) Procedure type:    Complexity:  Simple Procedure details:    Incision types:  Stab incision   Incision depth:  Dermal   Wound management:  Probed and deloculated and irrigated with saline   Drainage:  Purulent   Drainage amount:  Moderate   Wound treatment:  Wound left open   Packing materials:   None Post-procedure details:    Procedure completion:  Tolerated well, no immediate complications    Medications Ordered in the ED  doxycycline  (VIBRA -TABS) tablet 100 mg (has no administration in time range)  lidocaine  (PF) (XYLOCAINE ) 1 % injection 10 mL (10 mLs Infiltration Given by Other 12/07/23 1353)  ibuprofen  (ADVIL ) tablet 600 mg (600 mg Oral Given 12/07/23 1352)                                    Medical Decision Making Risk Prescription drug management.     Differential diagnosis includes but is not limited to cellulitis, abscess, dermoid cyst, septic arthritis  ED Course:  Upon initial evaluation, patient is well-appearing, no acute distress.  Stable vitals.  Has abscess noted to the right axilla, approximately 2.5 cm in diameter.  Indurated feeling on exam.  There is mild overlying erythema, but no extension of erythema into the surrounding skin or shoulder joint.  She has full range of motion of the right shoulder, low concern for septic arthritis.  The abscess was ultrasounded my myself and the pocket of pus was visualized.  I recommended incision and drainage which patient was in agreement with.  The area was drained as per the note above.  Moderate amount of purulent drainage expelled.  Patient tolerated this well.  Given the significant soft tissue induration and edema, will also start patient on course of doxycycline .  Was given first dose here today.  Stable and appropriate for discharge  Medications Given: Ibuprofen  for pain Doxycycline   Impression: Right axilla abscess  Disposition:  The patient was discharged home with instructions to keep the area clean with soap and water.  Apply warm compresses to help the area drain.  Take 5-day course of doxycycline  as prescribed.  Tylenol  and ibuprofen  as needed for pain. Return precautions given.   This chart was dictated using voice recognition software, Dragon. Despite the best efforts of this provider to  proofread and correct errors, errors may still occur which can change documentation meaning.       Final diagnoses:  Abscess of right axilla    ED Discharge Orders          Ordered    doxycycline  (VIBRAMYCIN ) 100 MG capsule  2 times daily        12/07/23 1434               Veta Palma, PA-C 12/07/23 1444    Zackowski, Scott, MD 12/09/23 2030

## 2023-12-26 ENCOUNTER — Emergency Department (HOSPITAL_BASED_OUTPATIENT_CLINIC_OR_DEPARTMENT_OTHER)
Admission: EM | Admit: 2023-12-26 | Discharge: 2023-12-26 | Disposition: A | Discharge status: Home / Self Care | Attending: Emergency Medicine | Admitting: Emergency Medicine

## 2023-12-26 ENCOUNTER — Other Ambulatory Visit: Payer: Self-pay

## 2023-12-26 ENCOUNTER — Emergency Department (HOSPITAL_BASED_OUTPATIENT_CLINIC_OR_DEPARTMENT_OTHER): Admitting: Radiology

## 2023-12-26 DIAGNOSIS — S99921A Unspecified injury of right foot, initial encounter: Secondary | ICD-10-CM | POA: Insufficient documentation

## 2023-12-26 DIAGNOSIS — Z23 Encounter for immunization: Secondary | ICD-10-CM | POA: Diagnosis not present

## 2023-12-26 DIAGNOSIS — W1841XA Slipping, tripping and stumbling without falling due to stepping on object, initial encounter: Secondary | ICD-10-CM | POA: Insufficient documentation

## 2023-12-26 DIAGNOSIS — T148XXA Other injury of unspecified body region, initial encounter: Secondary | ICD-10-CM

## 2023-12-26 DIAGNOSIS — R2241 Localized swelling, mass and lump, right lower limb: Secondary | ICD-10-CM | POA: Diagnosis not present

## 2023-12-26 MED ORDER — CIPROFLOXACIN HCL 500 MG PO TABS: 500.0000 mg | ORAL_TABLET | Freq: Two times a day (BID) | ORAL | 0 refills | Status: AC

## 2023-12-26 MED ORDER — OXYCODONE-ACETAMINOPHEN 5-325 MG PO TABS
1.0000 | ORAL_TABLET | Freq: Once | ORAL | Status: AC
  Administered 2023-12-26: 1 via ORAL
  Filled 2023-12-26: qty 1

## 2023-12-26 MED ORDER — IBUPROFEN 800 MG PO TABS
800.0000 mg | ORAL_TABLET | Freq: Once | ORAL | Status: AC
  Administered 2023-12-26: 800 mg via ORAL
  Filled 2023-12-26: qty 1

## 2023-12-26 MED ORDER — CIPROFLOXACIN HCL 500 MG PO TABS
500.0000 mg | ORAL_TABLET | Freq: Once | ORAL | Status: AC
  Administered 2023-12-26: 500 mg via ORAL
  Filled 2023-12-26: qty 1

## 2023-12-26 MED ORDER — TETANUS-DIPHTH-ACELL PERTUSSIS 5-2.5-18.5 LF-MCG/0.5 IM SUSY
0.5000 mL | PREFILLED_SYRINGE | Freq: Once | INTRAMUSCULAR | Status: AC
  Administered 2023-12-26: 0.5 mL via INTRAMUSCULAR
  Filled 2023-12-26: qty 0.5

## 2023-12-26 NOTE — ED Provider Notes (Signed)
 [place] EMERGENCY DEPARTMENT AT [facility] Provider Note   CSN: [id number] Arrival date & time: 12/26/23  9695     Patient presents with: Foot Injury (R)   Stacey Shepard is a 33 y.o. female.   Wearing flip flops and no socks stepped on a nail at [employer] 7/9 around 1600. Pain and swelling worsening since then. Using daughters crutches. Unk tdap. No fevers.    Foot Injury      Prior to Admission medications   Medication Sig Start Date End Date Taking? Authorizing Provider  ciprofloxacin  (CIPRO ) 500 MG tablet Take 1 tablet (500 mg total) by mouth 2 (two) times daily. 12/26/23  Yes Mckenze Slone, Selinda, MD  cyclobenzaprine  (FLEXERIL ) 5 MG tablet Take 1 tablet (5 mg total) by mouth 3 (three) times daily. 07/08/22   Darral Longs, MD  Prenatal Vit-Fe Phos-FA-Omega (VITAFOL  GUMMIES) 3.33-0.333-34.8 MG CHEW Chew 1 tablet by mouth daily. 06/08/22   Erik Kieth BROCKS, MD  norethindrone-ethinyl estradiol 1/35 (ORTHO-NOVUM  1/35, 28,) tablet Take 1 tablet by mouth daily. 03/04/19 10/05/19  Constant, Peggy, MD    Allergies: Patient has no known allergies.    Review of Systems  Updated Vital Signs BP 103/70 (BP Location: Right Arm)   Pulse 75   Temp 98 F (36.7 C) (Oral)   Resp 18   Ht 5' 4 (1.626 m)   Wt 58.1 kg   LMP 12/02/2023   SpO2 100%   BMI 21.97 kg/m   Physical Exam Vitals and nursing note reviewed.  Constitutional:      Appearance: She is well-developed.  HENT:     Head: Normocephalic and atraumatic.  Cardiovascular:     Rate and Rhythm: Normal rate and regular rhythm.  Pulmonary:     Effort: No respiratory distress.     Breath sounds: No stridor.  Abdominal:     General: There is no distension.  Musculoskeletal:     Cervical back: Normal range of motion.  Skin:    Comments: Puncture wound around base of 3rd toe plantar surface. Edema surrounding it and also on the dorsal surface. No erythema, no drainage.   Neurological:     Mental Status:  She is alert.     (all labs ordered are listed, but only abnormal results are displayed) Labs Reviewed - No data to display  EKG: None  Radiology: DG Foot Complete Right Result Date: 12/26/2023 CLINICAL DATA:  Injury to right foot. Patient stepped on a nail yesterday. EXAM: RIGHT FOOT COMPLETE - 3+ VIEW COMPARISON:  None Available. FINDINGS: There is no evidence of fracture or dislocation. There is no evidence of arthropathy or other focal bone abnormality. Soft tissues are unremarkable. No radiopaque soft tissue foreign bodies or gas identified. IMPRESSION: Negative. Electronically Signed   By: Elsie Gravely M.D.   On: 12/26/2023 03:45     Procedures   Medications Ordered in the ED  Tdap (BOOSTRIX ) injection 0.5 mL (0.5 mLs Intramuscular Given 12/26/23 0347)  ciprofloxacin  (CIPRO ) tablet 500 mg (500 mg Oral Given 12/26/23 0346)  oxyCODONE -acetaminophen  (PERCOCET/ROXICET) 5-325 MG per tablet 1 tablet (1 tablet Oral Given 12/26/23 0346)  ibuprofen  (ADVIL ) tablet 800 mg (800 mg Oral Given 12/26/23 0346)                                    Medical Decision Making Amount and/or Complexity of Data Reviewed Radiology: ordered.  Risk Prescription drug management.  Xr to rule out bony injury.  Ppx abx Update tdap  Xr ok. Tdap updated. Started cipro  for ppx. Wound care per nursing, acewrap afterwards.      Final diagnoses:  None    ED Discharge Orders          Ordered    ciprofloxacin  (CIPRO ) 500 MG tablet  2 times daily        12/26/23 0352               Rhiley Tarver, Selinda, MD 12/27/23 0002

## 2023-12-26 NOTE — ED Triage Notes (Signed)
 Pt reports stepping on a nail yesterday resulting in injury to her right foot. Unknown last tetanus.
